# Patient Record
Sex: Female | Born: 1995 | Race: Black or African American | Hispanic: No | Marital: Single | Smoking: Never smoker
Health system: Southern US, Community
[De-identification: ages and names within clinical notes are randomized; demographics above are authoritative.]

## PROBLEM LIST (undated history)

## (undated) ENCOUNTER — Inpatient Hospital Stay (HOSPITAL_COMMUNITY): Payer: Self-pay

## (undated) DIAGNOSIS — O2340 Unspecified infection of urinary tract in pregnancy, unspecified trimester: Secondary | ICD-10-CM

## (undated) DIAGNOSIS — Z862 Personal history of diseases of the blood and blood-forming organs and certain disorders involving the immune mechanism: Secondary | ICD-10-CM

## (undated) DIAGNOSIS — K429 Umbilical hernia without obstruction or gangrene: Secondary | ICD-10-CM

## (undated) DIAGNOSIS — O23599 Infection of other part of genital tract in pregnancy, unspecified trimester: Secondary | ICD-10-CM

## (undated) DIAGNOSIS — A5901 Trichomonal vulvovaginitis: Secondary | ICD-10-CM

## (undated) HISTORY — PX: HERNIA REPAIR: SHX51

## (undated) HISTORY — DX: Infection of other part of genital tract in pregnancy, unspecified trimester: O23.599

## (undated) HISTORY — DX: Trichomonal vulvovaginitis: A59.01

## (undated) HISTORY — DX: Unspecified infection of urinary tract in pregnancy, unspecified trimester: O23.40

## (undated) HISTORY — DX: Personal history of diseases of the blood and blood-forming organs and certain disorders involving the immune mechanism: Z86.2

---
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
## 2014-08-13 ENCOUNTER — Encounter: Payer: Self-pay | Admitting: Emergency Medicine

## 2014-08-13 DIAGNOSIS — K5900 Constipation, unspecified: Secondary | ICD-10-CM | POA: Diagnosis present

## 2014-08-13 DIAGNOSIS — B009 Herpesviral infection, unspecified: Secondary | ICD-10-CM | POA: Diagnosis not present

## 2014-08-13 DIAGNOSIS — K5641 Fecal impaction: Secondary | ICD-10-CM | POA: Diagnosis not present

## 2014-08-13 DIAGNOSIS — Z3202 Encounter for pregnancy test, result negative: Secondary | ICD-10-CM | POA: Insufficient documentation

## 2014-08-13 NOTE — ED Notes (Signed)
Has not had a real BM for 8-7.  States she took a laxative the Friday before last and was able to have a large bowel movement.  After that she only had liquid stools.  Took another laxative on the following Thursday.  Still has not had another solid formed bm.  States she normally has bowel movements every 2 days.  Also noted blood during her BM the Friday before last but it was a very difficult stool to pass.  No blood since then.

## 2014-08-14 ENCOUNTER — Emergency Department
Admission: EM | Admit: 2014-08-14 | Discharge: 2014-08-14 | Disposition: A | Discharge status: Home / Self Care | Payer: BLUE CROSS/BLUE SHIELD | Attending: Emergency Medicine | Admitting: Emergency Medicine

## 2014-08-14 ENCOUNTER — Emergency Department: Payer: BLUE CROSS/BLUE SHIELD

## 2014-08-14 DIAGNOSIS — B009 Herpesviral infection, unspecified: Secondary | ICD-10-CM

## 2014-08-14 DIAGNOSIS — K5641 Fecal impaction: Secondary | ICD-10-CM

## 2014-08-14 DIAGNOSIS — K5900 Constipation, unspecified: Secondary | ICD-10-CM

## 2014-08-14 HISTORY — DX: Umbilical hernia without obstruction or gangrene: K42.9

## 2014-08-14 LAB — POCT PREGNANCY, URINE: PREG TEST UR: NEGATIVE

## 2014-08-14 MED ORDER — MAGNESIUM CITRATE PO SOLN
1.0000 | Freq: Once | ORAL | Status: AC
  Administered 2014-08-14: 1 via ORAL

## 2014-08-14 MED ORDER — VALACYCLOVIR HCL 1 G PO TABS: 1000.0000 mg | ORAL_TABLET | Freq: Three times a day (TID) | ORAL | Status: DC

## 2014-08-14 MED ORDER — MAGNESIUM CITRATE PO SOLN
ORAL | Status: AC
  Administered 2014-08-14: 1 via ORAL
  Filled 2014-08-14: qty 296

## 2014-08-14 MED ORDER — DOCUSATE SODIUM 50 MG/5ML PO LIQD
ORAL | Status: AC
  Filled 2014-08-14: qty 10

## 2014-08-14 MED ORDER — LACTULOSE 10 GM/15ML PO SOLN: 20.0000 g | Freq: Every day | ORAL | Status: DC | PRN

## 2014-08-14 NOTE — ED Provider Notes (Signed)
[facility] Emergency Department Provider Note  ____________________________________________  Time seen: Approximately 3:56 AM  I have reviewed the triage vital signs and the nursing notes.   HISTORY  Chief Complaint Constipation    HPI Haley Scott is a 19 y.o. female who presents with constipation. States she usually has bowel movements every 2 days. Has not had a "good" BM for 7-8 days. Denies recent change in diet, medication or travel. States she took a laxity of approximately 6 days ago with good results but has had only liquid stools since then. Took another laxity of 3 days ago without having a solid formed BM. Patient complains of hard balls of stool which require straining to push out. Patient presents because she noted blood on her attempt to have a BM several days ago. Patient denies fever, chills, chest pain, shortness of breath, abdominal pain, nausea, vomiting, fainting, headache, weakness, numbness, tingling.   Past Medical History  Diagnosis Date  . Umbilical hernia 2001    There are no active problems to display for this patient.   Past Surgical History  Procedure Laterality Date  . Vaginal delivery  2015    Current Outpatient Rx  Name  Route  Sig  Dispense  Refill  . lactulose (CHRONULAC) 10 GM/15ML solution   Oral   Take 30 mLs (20 g total) by mouth daily as needed for mild constipation.   120 mL   0     Allergies Ibuprofen  No family history on file.  Social History History  Substance Use Topics  . Smoking status: Never Smoker   . Smokeless tobacco: Not on file  . Alcohol Use: No    Review of Systems Constitutional: No fever/chills Eyes: No visual changes. ENT: No sore throat. Cardiovascular: Denies chest pain. Respiratory: Denies shortness of breath. Gastrointestinal: No abdominal pain.  No nausea, no vomiting.  No diarrhea.  Positive for constipation. Genitourinary: Negative for dysuria. Musculoskeletal:  Negative for back pain. Skin: Negative for rash. Neurological: Negative for headaches, focal weakness or numbness.  10-point ROS otherwise negative.  ____________________________________________   PHYSICAL EXAM:  VITAL SIGNS: ED Triage Vitals  Enc Vitals Group     BP 08/13/14 2312 118/65 mmHg     Pulse Rate 08/13/14 2312 89     Resp 08/13/14 2312 18     Temp 08/13/14 2312 98.3 F (36.8 C)     Temp Source 08/13/14 2312 Oral     SpO2 08/13/14 2312 97 %     Weight 08/13/14 2312 105 lb (47.628 kg)     Height 08/13/14 2312 4\' 11"  (1.499 m)     Head Cir --      Peak Flow --      Pain Score 08/13/14 2313 8     Pain Loc --      Pain Edu? --      Excl. in GC? --     Constitutional: Alert and oriented. Well appearing and in no acute distress. Eyes: Conjunctivae are normal. PERRL. EOMI. Head: Atraumatic. Nose: No congestion/rhinnorhea. Mouth/Throat: Mucous membranes are moist.  Oropharynx non-erythematous. Neck: No stridor.   Cardiovascular: Normal rate, regular rhythm. Grossly normal heart sounds.  Good peripheral circulation. Respiratory: Normal respiratory effort.  No retractions. Lungs CTAB. Gastrointestinal: Soft and nontender. No distention. Positive bowel sounds in all 4 quadrants. No abdominal bruits. No CVA tenderness. Genitourinary: Vesicles noted on bilateral external labia. Rectal: No external hemorrhoids visualized. Rectal exam reveals hard stool in rectal vault. Digital stimulation performed.  Small amount of stool disimpacted. No bleeding noted. Non-tender rectal exam. Musculoskeletal: No lower extremity tenderness nor edema.  No joint effusions. Neurologic:  Normal speech and language. No gross focal neurologic deficits are appreciated. Speech is normal. No gait instability. Skin:  Skin is warm, dry and intact. No rash noted. Psychiatric: Mood and affect are normal. Speech and behavior are normal.  ____________________________________________   LABS (all labs  ordered are listed, but only abnormal results are displayed)  Labs Reviewed  POCT PREGNANCY, URINE   ____________________________________________  EKG  None ____________________________________________  RADIOLOGY  Abdomen 3 way (viewed by me, interpreted by Dr. Karie Kirks): Large amount of retained large bowel stool without bowel obstruction.  Normal chest.  ____________________________________________   PROCEDURES  Procedure(s) performed: Manual disimpaction with small amount of stool removed.  Critical Care performed: No  ____________________________________________   INITIAL IMPRESSION / ASSESSMENT AND PLAN / ED COURSE  Pertinent labs & imaging results that were available during my care of the patient were reviewed by me and considered in my medical decision making (see chart for details).  20 year old female who presents with constipation and fecal impaction without vomiting or abdominal pain. X-ray does reveal no sign of obstruction. Discussed with patient and will proceed with a soapsuds enema in the ED.  ----------------------------------------- 6:16 AM on 08/14/2014 -----------------------------------------  Patient had a good bowel movement after soapsuds enema. Patient denies dysuria, vaginal discharge or pelvic pain. Will start patient on Valtrex for genital herpes. Encouraged her to inform her partner to seek treatment at the health [facility]. Discussed with patient and given strict return precautions. Patient verbalizes understanding and agrees with plan of care. ____________________________________________   FINAL CLINICAL IMPRESSION(S) / ED DIAGNOSES  Final diagnoses:  Constipation, unspecified constipation type  Fecal impaction  Herpes      Irean Hong, MD 08/14/14 [phone]

## 2014-08-14 NOTE — Discharge Instructions (Signed)
1. Take laxitve as needed for bowel movements (Lactulose). 2. Drink plenty of fluids daily. 3. Take Valtrex 1 g 3 times daily 7 days. 4. Notify any sexual partners to seek treatment at the health Department. 5. Return to the ER for worsening symptoms, persistent vomiting, abdominal pain or other concerns.  Constipation Constipation is when a person:  Poops (has a bowel movement) less than 3 times a week.  Has a hard time pooping.  Has poop that is dry, hard, or bigger than normal. HOME CARE   Eat foods with a lot of fiber in them. This includes fruits, vegetables, beans, and whole grains such as brown rice.  Avoid fatty foods and foods with a lot of sugar. This includes french fries, hamburgers, cookies, candy, and soda.  If you are not getting enough fiber from food, take products with added fiber in them (supplements).  Drink enough fluid to keep your pee (urine) clear or pale yellow.  Exercise on a regular basis, or as told by your doctor.  Go to the restroom when you feel like you need to poop. Do not hold it.  Only take medicine as told by your doctor. Do not take medicines that help you poop (laxatives) without talking to your doctor first. GET HELP RIGHT AWAY IF:   You have bright red blood in your poop (stool).  Your constipation lasts more than 4 days or gets worse.  You have belly (abdominal) or butt (rectal) pain.  You have thin poop (as thin as a pencil).  You lose weight, and it cannot be explained. MAKE SURE YOU:   Understand these instructions.  Will watch your condition.  Will get help right away if you are not doing well or get worse. Document Released: 08/06/2007 Document Revised: 02/22/2013 Document Reviewed: 11/29/2012 [facility] Patient Information 2015 [place], [place]. This information is not intended to replace advice given to you by your health care provider. Make sure you discuss any questions you have with your health care provider.  Fecal  Impaction A fecal impaction happens when there is a large, firm amount of stool (or feces) that cannot be passed. The impacted stool is usually in the rectum, which is the lowest part of the large bowel. The impacted stool can block the colon and cause significant problems. CAUSES  The longer stool stays in the rectum, the harder it gets. Anything that slows down your bowel movements can lead to fecal impaction, such as:  Constipation. This can be a long-standing (chronic) problem or can happen suddenly (acute).  Painful conditions of the rectum, such as hemorrhoids or anal fissures. The pain of these conditions can make you try to avoid having bowel movements.  Narcotic pain-relieving medicines, such as methadone, morphine, or codeine.  Not drinking enough fluids.  Inactivity and bed rest over long periods of time.  Diseases of the brain or nervous system that damage the nerves controlling the muscles of the intestines. SIGNS AND SYMPTOMS   Lack of normal bowel movements or changes in bowel patterns.  Sense of fullness in the rectum but unable to pass stool.  Pain or cramps in the abdominal area (often after meals).  Thin, watery discharge from the rectum. DIAGNOSIS  Your health care provider may suspect that you have a fecal impaction based on your symptoms and a physical exam. This will include an exam of your rectum. Sometimes X-rays or lab testing may be needed to confirm the diagnosis and to be sure there are no other  problems.  TREATMENT   Initially an impaction can be removed manually. Using a gloved finger, your health care provider can remove hard stool from your rectum.  Medicine is sometimes needed. A suppository or enema can be given in the rectum to soften the stool, which can stimulate a bowel movement. Medicines can also be given by mouth (orally).  Though rare, surgery may be needed if the colon has torn (perforated) due to blockage. HOME CARE INSTRUCTIONS    Develop regular bowel habits. This could include getting in the habit of having a bowel movement after your morning cup of coffee or after eating. Be sure to allow yourself enough time on the toilet.  Maintain a high-fiber diet.  Drink enough fluids to keep your urine clear or pale yellow as directed by your health care provider.  Exercise regularly.  If you begin to get constipated, increase the amount of fiber in your diet. Eat plenty of fruits, vegetables, whole wheat breads, bran, oatmeal, and similar products.  Take natural fiber laxatives or other laxatives only as directed by your health care provider. SEEK MEDICAL CARE IF:   You have ongoing rectal pain.  You require enemas or suppositories more than twice a week.  You have rectal bleeding.  You have continued problems, or you develop abdominal pain.  You have thin, pencil-like stools. SEEK IMMEDIATE MEDICAL CARE IF:  You have black or tarry stools. MAKE SURE YOU:   Understand these instructions.  Will watch your condition.  Will get help right away if you are not doing well or get worse. Document Released: 11/10/2003 Document Revised: 12/08/2012 Document Reviewed: 08/24/2012 [facility] Patient Information 2015 [place], [place]. This information is not intended to replace advice given to you by your health care provider. Make sure you discuss any questions you have with your health care provider.  Herpes Labialis You have a fever blister or cold sore (herpes labialis). These painful, grouped sores are caused by one of the herpes viruses (HSV1 most commonly). They are usually found around the lips and mouth, but the same infection can also affect other areas on the face such as the nose and eyes. Herpes infections take about 10 days to heal. They often occur again and again in the same spot. Other symptoms may include numbness and tingling in the involved skin, achiness, fever, and swollen glands in the neck. Colds,  emotional stress, injuries, or excess sunlight exposure all seem to make herpes reappear. Herpes lip infections are contagious. Direct contact with these sores can spread the infection. It can also be spread to other parts of your own body. TREATMENT  Herpes labialis is usually self-limited and resolves within 1 week. To reduce pain and swelling, apply ice packs frequently to the sores or suck on popsicles or frozen juice bars. Antiviral medicine may be used by mouth to shorten the duration of the breakout. Avoid spreading the infection by washing your hands often. Be careful not to touch your eyes or genital areas after handling the infected blisters. Do not kiss or have other intimate contact with others. After the blisters are completely healed you may resume contact. Use sunscreen to lessen recurrences.  If this is your first infection with herpes, or if you have a severe or repeated infections, your caregiver may prescribe one of the anti-viral drugs to speed up the healing. If you have sun-related flare-ups despite the use of sunscreen, starting oral anti-viral medicine before a prolonged exposure (going skiing or to the beach) can  prevent most episodes.  SEEK IMMEDIATE MEDICAL CARE IF:  You develop a headache, sleepiness, high fever, vomiting, or severe weakness.  You have eye irritation, pain, blurred vision or redness.  You develop a prolonged infection not getting better in 10 days. Document Released: 02/17/2005 Document Revised: 05/12/2011 Document Reviewed: 12/22/2008 [facility] Patient Information 2015 [place], [place]. This information is not intended to replace advice given to you by your health care provider. Make sure you discuss any questions you have with your health care provider.

## 2015-05-03 ENCOUNTER — Emergency Department
Admission: EM | Admit: 2015-05-03 | Discharge: 2015-05-03 | Disposition: A | Discharge status: Home / Self Care | Payer: Medicaid Other | Attending: Emergency Medicine | Admitting: Emergency Medicine

## 2015-05-03 ENCOUNTER — Encounter: Payer: Self-pay | Admitting: Emergency Medicine

## 2015-05-03 DIAGNOSIS — Z79899 Other long term (current) drug therapy: Secondary | ICD-10-CM | POA: Insufficient documentation

## 2015-05-03 DIAGNOSIS — K047 Periapical abscess without sinus: Secondary | ICD-10-CM | POA: Diagnosis not present

## 2015-05-03 DIAGNOSIS — K032 Erosion of teeth: Secondary | ICD-10-CM | POA: Diagnosis not present

## 2015-05-03 DIAGNOSIS — K0889 Other specified disorders of teeth and supporting structures: Secondary | ICD-10-CM | POA: Diagnosis present

## 2015-05-03 MED ORDER — LIDOCAINE-EPINEPHRINE 2 %-1:100000 IJ SOLN
1.7000 mL | Freq: Once | INTRAMUSCULAR | Status: AC
  Administered 2015-05-03: 1.7 mL
  Filled 2015-05-03: qty 1.7

## 2015-05-03 MED ORDER — MAGIC MOUTHWASH W/LIDOCAINE: 5.0000 mL | Freq: Four times a day (QID) | ORAL | Status: DC

## 2015-05-03 MED ORDER — AMOXICILLIN 875 MG PO TABS: 875.0000 mg | ORAL_TABLET | Freq: Two times a day (BID) | ORAL | Status: DC

## 2015-05-03 NOTE — ED Notes (Signed)
Dental pain right side for 2 weeks.

## 2015-05-03 NOTE — Discharge Instructions (Signed)
Dental Abscess °A dental abscess is a collection of pus in or around a tooth. °CAUSES °This condition is caused by a bacterial infection around the root of the tooth that involves the inner part of the tooth (pulp). It may result from: °· Severe tooth decay. °· Trauma to the tooth that allows bacteria to enter into the pulp, such as a broken or chipped tooth. °· Severe gum disease around a tooth. °SYMPTOMS °Symptoms of this condition include: °· Severe pain in and around the infected tooth. °· Swelling and redness around the infected tooth, in the mouth, or in the face. °· Tenderness. °· Pus drainage. °· Bad breath. °· Bitter taste in the mouth. °· Difficulty swallowing. °· Difficulty opening the mouth. °· Nausea. °· Vomiting. °· Chills. °· Swollen neck glands. °· Fever. °DIAGNOSIS °This condition is diagnosed with examination of the infected tooth. During the exam, your dentist may tap on the infected tooth. Your dentist will also ask about your medical and dental history and may order X-rays. °TREATMENT °This condition is treated by eliminating the infection. This may be done with: °· Antibiotic medicine. °· A root canal. This may be performed to save the tooth. °· Pulling (extracting) the tooth. This may also involve draining the abscess. This is done if the tooth cannot be saved. °HOME CARE INSTRUCTIONS °· Take medicines only as directed by your dentist. °· If you were prescribed antibiotic medicine, finish all of it even if you start to feel better. °· Rinse your mouth (gargle) often with salt water to relieve pain or swelling. °· Do not drive or operate heavy machinery while taking pain medicine. °· Do not apply heat to the outside of your mouth. °· Keep all follow-up visits as directed by your dentist. This is important. °SEEK MEDICAL CARE IF: °· Your pain is worse and is not helped by medicine. °SEEK IMMEDIATE MEDICAL CARE IF: °· You have a fever or chills. °· Your symptoms suddenly get worse. °· You have a  very bad headache. °· You have problems breathing or swallowing. °· You have trouble opening your mouth. °· You have swelling in your neck or around your eye. °  °This information is not intended to replace advice given to you by your health care provider. Make sure you discuss any questions you have with your health care provider. °  °Document Released: 02/17/2005 Document Revised: 07/04/2014 Document Reviewed: 02/14/2014 °Elsevier Interactive Patient Education ©2016 [employer] ° °Dental Care and Dentist Visits °Dental care supports good overall health. Regular dental visits can also help you avoid dental pain, bleeding, infection, and other more serious health problems in the future. It is important to keep the mouth healthy because diseases in the teeth, gums, and other oral tissues can spread to other areas of the body. Some problems, such as diabetes, heart disease, and pre-term labor have been associated with poor oral health.  °See your dentist every 6 months. If you experience emergency problems such as a toothache or broken tooth, go to the dentist right away. If you see your dentist regularly, you may catch problems early. It is easier to be treated for problems in the early stages.  °WHAT TO EXPECT AT A DENTIST VISIT  °Your dentist will look for many common oral health problems and recommend proper treatment. At your regular dental visit, you can expect: °· Gentle cleaning of the teeth and gums. This includes scraping and polishing. This helps to remove the sticky substance around the teeth and gums (  plaque). Plaque forms in the mouth shortly after eating. Over time, plaque hardens on the teeth as tartar. If tartar is not removed regularly, it can cause problems. Cleaning also helps remove stains. °· Periodic X-rays. These pictures of the teeth and supporting bone will help your dentist assess the health of your teeth. °· Periodic fluoride treatments. Fluoride is a natural mineral shown to help  strengthen teeth. Fluoride treatment involves applying a fluoride gel or varnish to the teeth. It is most commonly done in children. °· Examination of the mouth, tongue, jaws, teeth, and gums to look for any oral health problems, such as: °· Cavities (dental caries). This is decay on the tooth caused by plaque, sugar, and acid in the mouth. It is best to catch a cavity when it is small. °· Inflammation of the gums caused by plaque buildup (gingivitis). °· Problems with the mouth or malformed or misaligned teeth. °· Oral cancer or other diseases of the soft tissues or jaws.  °KEEP YOUR TEETH AND GUMS HEALTHY °For healthy teeth and gums, follow these general guidelines as well as your dentist's specific advice: °· Have your teeth professionally cleaned at the dentist every 6 months. °· Brush twice daily with a fluoride toothpaste. °· Floss your teeth daily.  °· Ask your dentist if you need fluoride supplements, treatments, or fluoride toothpaste. °· Eat a healthy diet. Reduce foods and drinks with added sugar. °· Avoid smoking. °TREATMENT FOR ORAL HEALTH PROBLEMS °If you have oral health problems, treatment varies depending on the conditions present in your teeth and gums. °· Your caregiver will most likely recommend good oral hygiene at each visit. °· For cavities, gingivitis, or other oral health disease, your caregiver will perform a procedure to treat the problem. This is typically done at a separate appointment. Sometimes your caregiver will refer you to another dental specialist for specific tooth problems or for surgery. °SEEK IMMEDIATE DENTAL CARE IF: °· You have pain, bleeding, or soreness in the gum, tooth, jaw, or mouth area. °· A permanent tooth becomes loose or separated from the gum socket. °· You experience a blow or injury to the mouth or jaw area. °  °This information is not intended to replace advice given to you by your health care provider. Make sure you discuss any questions you have with your  health care provider. °  °Document Released: 10/30/2010 Document Revised: 05/12/2011 Document Reviewed: 10/30/2010 °Elsevier Interactive Patient Education ©2016 [employer] ° °Dental Pain °Dental pain may be caused by many things, including: °· Tooth decay (cavities or caries). Cavities expose the nerve of your tooth to air and hot or cold temperatures. This can cause pain or discomfort. °· Abscess or infection. A dental abscess is a collection of infected pus from a bacterial infection in the inner part of the tooth (pulp). It usually occurs at the end of the tooth's root. °· Injury. °· An unknown reason (idiopathic). °Your pain may be mild or severe. It may only occur when: °· You are chewing. °· You are exposed to hot or cold temperature. °· You are eating or drinking sugary foods or beverages, such as soda or candy. °Your pain may also be constant. °HOME CARE INSTRUCTIONS °Watch your dental pain for any changes. The following actions may help to lessen any discomfort that you are feeling: °· Take medicines only as directed by your dentist. °· If you were prescribed an antibiotic medicine, finish all of it even if you start to feel better. °· Keep all follow-up visits   as directed by your dentist. This is important. °· Do not apply heat to the outside of your face. °· Rinse your mouth or gargle with salt water if directed by your dentist. This helps with pain and swelling. °¨ You can make salt water by adding ¼ tsp of salt to 1 cup of warm water. °· Apply ice to the painful area of your face: °¨ Put ice in a plastic bag. °¨ Place a towel between your skin and the bag. °¨ Leave the ice on for 20 minutes, 2-3 times per day. °· Avoid foods or drinks that cause you pain, such as: °¨ Very hot or very cold foods or drinks. °¨ Sweet or sugary foods or drinks. °SEEK MEDICAL CARE IF: °· Your pain is not controlled with medicines. °· Your symptoms are worse. °· You have new symptoms. °SEEK IMMEDIATE MEDICAL CARE  IF: °· You are unable to open your mouth. °· You are having trouble breathing or swallowing. °· You have a fever. °· Your face, neck, or jaw is swollen. °  °This information is not intended to replace advice given to you by your health care provider. Make sure you discuss any questions you have with your health care provider. °  °Document Released: 02/17/2005 Document Revised: 07/04/2014 Document Reviewed: 02/13/2014 °Elsevier Interactive Patient Education ©2016 [employer] ° °

## 2015-05-03 NOTE — ED Provider Notes (Signed)
[facility] Emergency Department Provider Note  ____________________________________________  Time seen: Approximately 4:43 PM  I have reviewed the triage vital signs and the nursing notes.   HISTORY  Chief Complaint Dental Pain    HPI Haley Scott is a 20 y.o. female who presents emergency department complaining of right lower dental pain. Patient states that this pain has been present 2 weeks. She has known dental erosion into the most into the right lower jaw. Patient states that she was seen by a dentist and told that she had "an infection" and was told to present to the emergency department for treatment of same. She has an appointment in 1 month with an oral surgeon to discuss dental extraction. Patient states the pain is sharp, severe, constant. Patient has not taken any medications prior to arrival. Patient denies any fevers or chills, difficulty breathing or swallowing, chest pain, shortness of breath, abdominal pain, nausea or vomiting.   Past Medical History  Diagnosis Date  . Umbilical hernia 2001    There are no active problems to display for this patient.   Past Surgical History  Procedure Laterality Date  . Vaginal delivery  2015  . Hernia repair      Current Outpatient Rx  Name  Route  Sig  Dispense  Refill  . amoxicillin (AMOXIL) 875 MG tablet   Oral   Take 1 tablet (875 mg total) by mouth 2 (two) times daily.   14 tablet   0   . lactulose (CHRONULAC) 10 GM/15ML solution   Oral   Take 30 mLs (20 g total) by mouth daily as needed for mild constipation.   120 mL   0   . magic mouthwash w/lidocaine SOLN   Oral   Take 5 mLs by mouth 4 (four) times daily.   240 mL   0     Dispense in a 1/1/1/1 ratio. Use lidocaine, diphen ...   . valACYclovir (VALTREX) 1000 MG tablet   Oral   Take 1 tablet (1,000 mg total) by mouth 3 (three) times daily.   21 tablet   0     Allergies Ibuprofen  No family history on file.  Social  History Social History  Substance Use Topics  . Smoking status: Never Smoker   . Smokeless tobacco: None  . Alcohol Use: No     Review of Systems  Constitutional: No fever/chills ENT: No sore throat. Endorses right lower dental pain. Cardiovascular: no chest pain. Respiratory: no cough. No SOB. Skin: Negative for rash. Neurological: Negative for headaches, focal weakness or numbness. 10-point ROS otherwise negative.  ____________________________________________   PHYSICAL EXAM:  VITAL SIGNS: ED Triage Vitals  Enc Vitals Group     BP 05/03/15 1613 118/79 mmHg     Pulse Rate 05/03/15 1613 110     Resp 05/03/15 1613 18     Temp 05/03/15 1613 99.4 F (37.4 C)     Temp Source 05/03/15 1613 Oral     SpO2 05/03/15 1613 100 %     Weight 05/03/15 1612 110 lb (49.896 kg)     Height 05/03/15 1612 5' (1.524 m)     Head Cir --      Peak Flow --      Pain Score 05/03/15 1612 10     Pain Loc --      Pain Edu? --      Excl. in GC? --      Constitutional: Alert and oriented. Well appearing and in  no acute distress. Eyes: Conjunctivae are normal. PERRL. EOMI. Head: Atraumatic. ENT:      Ears:       Nose: No congestion/rhinnorhea.      Mouth/Throat: Mucous membranes are moist. Significant dental erosion into the was no tooth on the right lower dentition. Surrounding erythema and edema. No fluctuance to palpation with tongue depressor. No pustular drainage noted. Oropharynx is nonerythematous and nonedematous. Uvula is midline. Neck: No stridor.   Hematological/Lymphatic/Immunilogical: No cervical lymphadenopathy. Cardiovascular: Normal rate, regular rhythm. Normal S1 and S2.  Good peripheral circulation. Respiratory: Normal respiratory effort without tachypnea or retractions. Lungs CTAB. Neurologic:  Normal speech and language. No gross focal neurologic deficits are appreciated.  Skin:  Skin is warm, dry and intact. No rash noted. Psychiatric: Mood and affect are normal. Speech  and behavior are normal. Patient exhibits appropriate insight and judgement.   ____________________________________________   LABS (all labs ordered are listed, but only abnormal results are displayed)  Labs Reviewed - No data to display ____________________________________________  EKG   ____________________________________________  RADIOLOGY   No results found.  ____________________________________________    PROCEDURES  Procedure(s) performed:    Dental block is performed using 2% lidocaine with epi using a 27-gauge needle using the dental Carpijet. Injection is placed into the angle of the mandible infiltrating the mandibular branch of the trigeminal nerve. Patient tolerated procedure well with no complications.   Medications  lidocaine-EPINEPHrine (XYLOCAINE W/EPI) 2 %-1:100000 (with pres) injection 1.7 mL (1.7 mLs Infiltration Given 05/03/15 1652)     ____________________________________________   INITIAL IMPRESSION / ASSESSMENT AND PLAN / ED COURSE  Pertinent labs & imaging results that were available during my care of the patient were reviewed by me and considered in my medical decision making (see chart for details).  Patient's diagnosis is consistent with dental erosion with dental infection. Patient is given a dental block in the emergency department.. Patient will be discharged home with prescriptions for antibiotics and magic mouthwash. Patient is to follow up with dentist if symptoms persist past this treatment course. Patient is given ED precautions to return to the ED for any worsening or new symptoms.     ____________________________________________  FINAL CLINICAL IMPRESSION(S) / ED DIAGNOSES  Final diagnoses:  Dental erosion extending into pulp  Dental infection      NEW MEDICATIONS STARTED DURING THIS VISIT:  New Prescriptions   AMOXICILLIN (AMOXIL) 875 MG TABLET    Take 1 tablet (875 mg total) by mouth 2 (two) times daily.   MAGIC  MOUTHWASH W/LIDOCAINE SOLN    Take 5 mLs by mouth 4 (four) times daily.        Delorise Royals Tagg Eustice, PA-C 05/03/15 1654  Loleta Rose, MD 05/04/15 [phone]

## 2015-06-07 DIAGNOSIS — R509 Fever, unspecified: Secondary | ICD-10-CM | POA: Insufficient documentation

## 2015-06-07 DIAGNOSIS — Z79899 Other long term (current) drug therapy: Secondary | ICD-10-CM | POA: Insufficient documentation

## 2015-06-07 DIAGNOSIS — R52 Pain, unspecified: Secondary | ICD-10-CM | POA: Diagnosis not present

## 2015-06-07 DIAGNOSIS — R69 Illness, unspecified: Secondary | ICD-10-CM | POA: Diagnosis not present

## 2015-06-07 MED ORDER — ACETAMINOPHEN 325 MG PO TABS
ORAL_TABLET | ORAL | Status: AC
  Filled 2015-06-07: qty 2

## 2015-06-07 MED ORDER — ACETAMINOPHEN 325 MG PO TABS
650.0000 mg | ORAL_TABLET | Freq: Once | ORAL | Status: AC
  Administered 2015-06-07: 650 mg via ORAL

## 2015-06-07 NOTE — ED Notes (Signed)
Pt in with co fever and headache for few days.  Was exposed to the flu over the weekend.

## 2015-06-08 ENCOUNTER — Emergency Department
Admission: EM | Admit: 2015-06-08 | Discharge: 2015-06-08 | Disposition: A | Discharge status: Home / Self Care | Payer: Medicaid Other | Attending: Emergency Medicine | Admitting: Emergency Medicine

## 2015-06-08 DIAGNOSIS — R509 Fever, unspecified: Secondary | ICD-10-CM

## 2015-06-08 DIAGNOSIS — B349 Viral infection, unspecified: Secondary | ICD-10-CM

## 2015-06-08 DIAGNOSIS — R52 Pain, unspecified: Secondary | ICD-10-CM

## 2015-06-08 LAB — RAPID INFLUENZA A&B ANTIGENS
Influenza A (ARMC): NEGATIVE
Influenza B (ARMC): NEGATIVE

## 2015-06-08 LAB — POCT RAPID STREP A: Streptococcus, Group A Screen (Direct): NEGATIVE

## 2015-06-08 NOTE — Discharge Instructions (Signed)
Fever, Adult A fever is an increase in the body's temperature. It is usually defined as a temperature of 100F (38C) or higher. Brief mild or moderate fevers generally have no long-term effects, and they often do not require treatment. Moderate or high fevers may make you feel uncomfortable and can sometimes be a sign of a serious illness or disease. The sweating that may occur with repeated or prolonged fever may also cause dehydration. Fever is confirmed by taking a temperature with a thermometer. A measured temperature can vary with:  Age.  Time of day.  Location of the thermometer:  Mouth (oral).  Rectum (rectal).  Ear (tympanic).  Underarm (axillary).  Forehead (temporal). HOME CARE INSTRUCTIONS Pay attention to any changes in your symptoms. Take these actions to help with your condition:  Take over-the counter and prescription medicines only as told by your health care provider. Follow the dosing instructions carefully.  If you were prescribed an antibiotic medicine, take it as told by your health care provider. Do not stop taking the antibiotic even if you start to feel better.  Rest as needed.  Drink enough fluid to keep your urine clear or pale yellow. This helps to prevent dehydration.  Sponge yourself or bathe with room-temperature water to help reduce your body temperature as needed. Do not use ice water.  Do not overbundle yourself in blankets or heavy clothes. SEEK MEDICAL CARE IF:  You vomit.  You cannot eat or drink without vomiting.  You have diarrhea.  You have pain when you urinate.  Your symptoms do not improve with treatment.  You develop new symptoms.  You develop excessive weakness. SEEK IMMEDIATE MEDICAL CARE IF:  You have shortness of breath or have trouble breathing.  You are dizzy or you faint.  You are disoriented or confused.  You develop signs of dehydration, such as a dry mouth, decreased urination, or paleness.  You develop  severe pain in your abdomen.  You have persistent vomiting or diarrhea.  You develop a skin rash.  Your symptoms suddenly get worse.   This information is not intended to replace advice given to you by your health care provider. Make sure you discuss any questions you have with your health care provider.   Document Released: 08/13/2000 Document Revised: 11/08/2014 Document Reviewed: 04/13/2014 Elsevier Interactive Patient Education 2016 [employer]  Musculoskeletal Pain Musculoskeletal pain is muscle and boney aches and pains. These pains can occur in any part of the body. Your caregiver may treat you without knowing the cause of the pain. They may treat you if blood or urine tests, X-rays, and other tests were normal.  CAUSES There is often not a definite cause or reason for these pains. These pains may be caused by a type of germ (virus). The discomfort may also come from overuse. Overuse includes working out too hard when your body is not fit. Boney aches also come from weather changes. Bone is sensitive to atmospheric pressure changes. HOME CARE INSTRUCTIONS   Ask when your test results will be ready. Make sure you get your test results.  Only take over-the-counter or prescription medicines for pain, discomfort, or fever as directed by your caregiver. If you were given medications for your condition, do not drive, operate machinery or power tools, or sign legal documents for 24 hours. Do not drink alcohol. Do not take sleeping pills or other medications that may interfere with treatment.  Continue all activities unless the activities cause more pain. When the pain lessens,  slowly resume normal activities. Gradually increase the intensity and duration of the activities or exercise.  During periods of severe pain, bed rest may be helpful. Lay or sit in any position that is comfortable.  Putting ice on the injured area.  Put ice in a bag.  Place a towel between your skin and the  bag.  Leave the ice on for 15 to 20 minutes, 3 to 4 times a day.  Follow up with your caregiver for continued problems and no reason can be found for the pain. If the pain becomes worse or does not go away, it may be necessary to repeat tests or do additional testing. Your caregiver may need to look further for a possible cause. SEEK IMMEDIATE MEDICAL CARE IF:  You have pain that is getting worse and is not relieved by medications.  You develop chest pain that is associated with shortness or breath, sweating, feeling sick to your stomach (nauseous), or throw up (vomit).  Your pain becomes localized to the abdomen.  You develop any new symptoms that seem different or that concern you. MAKE SURE YOU:   Understand these instructions.  Will watch your condition.  Will get help right away if you are not doing well or get worse.   This information is not intended to replace advice given to you by your health care provider. Make sure you discuss any questions you have with your health care provider.   Document Released: 02/17/2005 Document Revised: 05/12/2011 Document Reviewed: 10/22/2012 Elsevier Interactive Patient Education [employer]

## 2015-06-08 NOTE — ED Notes (Signed)
Pt states she has had fever, sore throat, and body aches since Sunday. Not feeling better and concerned she needed to be seen. States when she takes tylenol at home she feels better. No increased work of breathing or acute distress noted. Denies cough or congestion. Alert and calm at this time.

## 2015-06-08 NOTE — ED Provider Notes (Signed)
[facility] Emergency Department Provider Note  ____________________________________________  Time seen: Approximately 0031 AM  I have reviewed the triage vital signs and the nursing notes.   HISTORY  Chief Complaint Fever    HPI Joanie CoddingtonKieshawn Biederman is a 20 y.o. female who comes into the hospital today because she has not been feeling well. She reports that she watch her nephew on Sunday. She reports that he had the flu and she started feeling unwell on Tuesday. She reports that she had some fever and sore throat. She never took her temperature but did take some Tylenol. She reports that the fever was better and she thought that she was okay but the fever came back today. The patient reports that she also had a headache with the fever. She reports that in the waiting room she was given some Tylenol and she feels much better than she did when she initially arrived. The patient reports that she has been eating and drinking with no vomiting and diarrhea. She's had no cough that she's had some mild body aches. The patient reports that since she was feeling unwell she decided to come in and get checked out. Again the patient denies any pain at this time.   Past Medical History  Diagnosis Date  . Umbilical hernia 2001    There are no active problems to display for this patient.   Past Surgical History  Procedure Laterality Date  . Vaginal delivery  2015  . Hernia repair      Current Outpatient Rx  Name  Route  Sig  Dispense  Refill  . amoxicillin (AMOXIL) 875 MG tablet   Oral   Take 1 tablet (875 mg total) by mouth 2 (two) times daily.   14 tablet   0   . lactulose (CHRONULAC) 10 GM/15ML solution   Oral   Take 30 mLs (20 g total) by mouth daily as needed for mild constipation.   120 mL   0   . magic mouthwash w/lidocaine SOLN   Oral   Take 5 mLs by mouth 4 (four) times daily.   240 mL   0     Dispense in a 1/1/1/1 ratio. Use lidocaine, diphen ...   .  valACYclovir (VALTREX) 1000 MG tablet   Oral   Take 1 tablet (1,000 mg total) by mouth 3 (three) times daily.   21 tablet   0     Allergies Ibuprofen  No family history on file.  Social History Social History  Substance Use Topics  . Smoking status: Never Smoker   . Smokeless tobacco: Not on file  . Alcohol Use: No    Review of Systems Constitutional:  fever/chills Eyes: No visual changes. ENT:  sore throat. Cardiovascular: Denies chest pain. Respiratory: Denies shortness of breath. Gastrointestinal: No abdominal pain.  No nausea, no vomiting.  No diarrhea.  No constipation. Genitourinary: Negative for dysuria. Musculoskeletal: body aches Skin: Negative for rash. Neurological: headache  10-point ROS otherwise negative.  ____________________________________________   PHYSICAL EXAM:  VITAL SIGNS: ED Triage Vitals  Enc Vitals Group     BP 06/07/15 2328 110/71 mmHg     Pulse Rate 06/07/15 2328 118     Resp 06/07/15 2328 18     Temp 06/07/15 2328 100.1 F (37.8 C)     Temp Source 06/07/15 2328 Oral     SpO2 06/07/15 2328 100 %     Weight 06/07/15 2327 112 lb (50.803 kg)     Height 06/07/15  2327  (1.499 m)     Head Cir --      Peak Flow --      Pain Score 06/07/15 2328 8     Pain Loc --      Pain Edu? --      Excl. in GC? --     Constitutional: Alert and oriented. Well appearing and in mild distress. Eyes: Conjunctivae are normal. PERRL. EOMI. Head: Atraumatic. Nose: No congestion/rhinnorhea. Mouth/Throat: Mucous membranes are moist.  Oropharynx non-erythematous. Cardiovascular: Tachycardia regular rhythm. Grossly normal heart sounds.  Good peripheral circulation. Respiratory: Normal respiratory effort.  No retractions. Lungs CTAB. Gastrointestinal: Soft and nontender. No distention. Positive bowel sounds Musculoskeletal: No lower extremity tenderness nor edema.  Neurologic:  Normal speech and language. Skin:  Skin is warm, dry and intact.   Psychiatric: Mood and affect are normal.   ____________________________________________   LABS (all labs ordered are listed, but only abnormal results are displayed)  Labs Reviewed  RAPID INFLUENZA A&B ANTIGENS ([facility] ONLY)  POCT RAPID STREP A   ____________________________________________  EKG  none ____________________________________________  RADIOLOGY  none ____________________________________________   PROCEDURES  Procedure(s) performed: None  Critical Care performed: No  ____________________________________________   INITIAL IMPRESSION / ASSESSMENT AND PLAN / ED COURSE  Pertinent labs & imaging results that were available during my care of the patient were reviewed by me and considered in my medical decision making (see chart for details).  This is a 20 year old female who comes into the hospital today with some body aches and fever. I will check the patient's flu as well as strep throat. The patient reports though that after some Tylenol she feels improved. I will give the patient some oral hydration to help with her tachycardia. I will reassess the patient once I received the results.  Since strep and flu are both negative. At this time we'll reassess the patient's vital signs at determine if she is still tachycardic and disposition the patient to home.  The patient continues to feel well and her heart rate is improved. She'll be discharged home to follow-up with the acute care clinic. ____________________________________________   FINAL CLINICAL IMPRESSION(S) / ED DIAGNOSES  Final diagnoses:  Fever, unspecified fever cause  Body aches  Viral illness      Rebecka Apley, MD 06/08/15 [phone]

## 2015-06-08 NOTE — ED Notes (Signed)
Pt given popsicle and encouraged to keep drinking her gatorade. Pt verbalized understanding. Denies pain.

## 2015-06-08 NOTE — ED Notes (Signed)
Dr. Zenda AlpersWebster at the bedside for pt evaluation. Pt drinking Gatorade. No distress noted.

## 2015-09-10 ENCOUNTER — Encounter (HOSPITAL_COMMUNITY): Payer: Self-pay | Admitting: *Deleted

## 2015-09-10 ENCOUNTER — Inpatient Hospital Stay (HOSPITAL_COMMUNITY)
Admission: AD | Admit: 2015-09-10 | Discharge: 2015-09-10 | Disposition: A | Discharge status: Home / Self Care | Payer: Medicaid Other | Source: Ambulatory Visit | Attending: Obstetrics & Gynecology | Admitting: Obstetrics & Gynecology

## 2015-09-10 ENCOUNTER — Inpatient Hospital Stay (HOSPITAL_COMMUNITY): Payer: Medicaid Other

## 2015-09-10 DIAGNOSIS — D539 Nutritional anemia, unspecified: Secondary | ICD-10-CM | POA: Diagnosis not present

## 2015-09-10 DIAGNOSIS — O23591 Infection of other part of genital tract in pregnancy, first trimester: Secondary | ICD-10-CM | POA: Insufficient documentation

## 2015-09-10 DIAGNOSIS — N760 Acute vaginitis: Secondary | ICD-10-CM | POA: Diagnosis not present

## 2015-09-10 DIAGNOSIS — R102 Pelvic and perineal pain: Secondary | ICD-10-CM | POA: Diagnosis present

## 2015-09-10 DIAGNOSIS — Z3A11 11 weeks gestation of pregnancy: Secondary | ICD-10-CM | POA: Insufficient documentation

## 2015-09-10 DIAGNOSIS — Z886 Allergy status to analgesic agent status: Secondary | ICD-10-CM | POA: Insufficient documentation

## 2015-09-10 DIAGNOSIS — O26891 Other specified pregnancy related conditions, first trimester: Secondary | ICD-10-CM | POA: Diagnosis not present

## 2015-09-10 DIAGNOSIS — A499 Bacterial infection, unspecified: Secondary | ICD-10-CM

## 2015-09-10 DIAGNOSIS — Z79899 Other long term (current) drug therapy: Secondary | ICD-10-CM | POA: Insufficient documentation

## 2015-09-10 DIAGNOSIS — B9689 Other specified bacterial agents as the cause of diseases classified elsewhere: Secondary | ICD-10-CM | POA: Diagnosis not present

## 2015-09-10 DIAGNOSIS — Z3490 Encounter for supervision of normal pregnancy, unspecified, unspecified trimester: Secondary | ICD-10-CM

## 2015-09-10 DIAGNOSIS — D508 Other iron deficiency anemias: Secondary | ICD-10-CM

## 2015-09-10 LAB — URINALYSIS, ROUTINE W REFLEX MICROSCOPIC
Bilirubin Urine: NEGATIVE
Glucose, UA: NEGATIVE mg/dL
Hgb urine dipstick: NEGATIVE
KETONES UR: 40 mg/dL — AB
NITRITE: NEGATIVE
Protein, ur: NEGATIVE mg/dL
Specific Gravity, Urine: 1.025 (ref 1.005–1.030)
pH: 6 (ref 5.0–8.0)

## 2015-09-10 LAB — WET PREP, GENITAL
SPERM: NONE SEEN
TRICH WET PREP: NONE SEEN
YEAST WET PREP: NONE SEEN

## 2015-09-10 LAB — HCG, QUANTITATIVE, PREGNANCY: hCG, Beta Chain, Quant, S: 93684 m[IU]/mL — ABNORMAL HIGH (ref <5)

## 2015-09-10 LAB — ABO/RH: ABO/RH(D): O POS

## 2015-09-10 LAB — URINE MICROSCOPIC-ADD ON: RBC / HPF: NONE SEEN RBC/hpf (ref 0–5)

## 2015-09-10 LAB — POCT PREGNANCY, URINE: PREG TEST UR: POSITIVE — AB

## 2015-09-10 LAB — CBC
HCT: 28 % — ABNORMAL LOW (ref 36.0–46.0)
Hemoglobin: 8.9 g/dL — ABNORMAL LOW (ref 12.0–15.0)
MCH: 23.3 pg — AB (ref 26.0–34.0)
MCHC: 31.8 g/dL (ref 30.0–36.0)
MCV: 73.3 fL — ABNORMAL LOW (ref 78.0–100.0)
Platelets: 347 10*3/uL (ref 150–400)
RBC: 3.82 MIL/uL — ABNORMAL LOW (ref 3.87–5.11)
RDW: 18.4 % — AB (ref 11.5–15.5)
WBC: 8.1 10*3/uL (ref 4.0–10.5)

## 2015-09-10 MED ORDER — FERROUS SULFATE 325 (65 FE) MG PO TABS: 325.0000 mg | ORAL_TABLET | Freq: Three times a day (TID) | ORAL | Status: DC

## 2015-09-10 MED ORDER — METRONIDAZOLE 500 MG PO TABS: 500.0000 mg | ORAL_TABLET | Freq: Two times a day (BID) | ORAL | Status: DC

## 2015-09-10 NOTE — MAU Note (Signed)
Pt states she had a positive home preg test and wants to know how far along she is and she thinks she has a bacterial infection.

## 2015-09-10 NOTE — MAU Provider Note (Signed)
History     CSN: [id number]  Arrival date and time: 09/10/15 2024   First Provider Initiated Contact with Patient 09/10/15 2154      Chief Complaint  Patient presents with  . Vaginal Discharge  . Pelvic Pain   Vaginal Discharge The patient's primary symptoms include pelvic pain and vaginal discharge. This is a new problem. The current episode started yesterday. The problem occurs constantly. The problem has been unchanged. Pain severity now: 6/10  The problem affects both sides. She is pregnant. Associated symptoms include back pain and nausea. Pertinent negatives include no chills, constipation, diarrhea, dysuria, fever, frequency, urgency or vomiting. The vaginal discharge was normal (but increased recently). There has been no bleeding. Menstrual history: LMP 06/29/15     Past Medical History  Diagnosis Date  . Umbilical hernia 2001    Past Surgical History  Procedure Laterality Date  . Vaginal delivery  2015  . Hernia repair      No family history on file.  Social History  Substance Use Topics  . Smoking status: Never Smoker   . Smokeless tobacco: Not on file  . Alcohol Use: No    Allergies:  Allergies  Allergen Reactions  . Ibuprofen Hives    Prescriptions prior to admission  Medication Sig Dispense Refill Last Dose  . amoxicillin (AMOXIL) 875 MG tablet Take 1 tablet (875 mg total) by mouth 2 (two) times daily. 14 tablet 0   . lactulose (CHRONULAC) 10 GM/15ML solution Take 30 mLs (20 g total) by mouth daily as needed for mild constipation. 120 mL 0   . magic mouthwash w/lidocaine SOLN Take 5 mLs by mouth 4 (four) times daily. 240 mL 0   . valACYclovir (VALTREX) 1000 MG tablet Take 1 tablet (1,000 mg total) by mouth 3 (three) times daily. 21 tablet 0     Review of Systems  Constitutional: Negative for fever and chills.  Gastrointestinal: Positive for nausea. Negative for vomiting, diarrhea and constipation.  Genitourinary: Positive for vaginal discharge and  pelvic pain. Negative for dysuria, urgency and frequency.       Urine is malodorous   Musculoskeletal: Positive for back pain.   Physical Exam   Blood pressure 104/62, pulse 102, temperature 98.5 F (36.9 C), temperature source Oral, resp. rate 18, height 5' (1.524 m), weight 49.497 kg (109 lb 1.9 oz), last menstrual period 06/29/2015, SpO2 100 %.  Physical Exam  Nursing note and vitals reviewed. Constitutional: She is oriented to person, place, and time. She appears well-developed and well-nourished. No distress.  HENT:  Head: Normocephalic.  Cardiovascular: Normal rate.   Respiratory: Effort normal.  GI: Soft. There is no tenderness. There is no rebound.  Genitourinary:   External: no lesion Vagina: small amount of white discharge Cervix: pink, smooth, no CMT Uterus: NSSC Adnexa: NT   Neurological: She is alert and oriented to person, place, and time.  Skin: Skin is warm and dry.  Psychiatric: She has a normal mood and affect.   Results for orders placed or performed during the hospital encounter of 09/10/15 (from the past 24 hour(s))  Urinalysis, Routine w reflex microscopic (not at [facility])     Status: Abnormal   Collection Time: 09/10/15  9:05 PM  Result Value Ref Range   Color, Urine YELLOW YELLOW   APPearance CLEAR CLEAR   Specific Gravity, Urine 1.025 1.005 - 1.030   pH 6.0 5.0 - 8.0   Glucose, UA NEGATIVE NEGATIVE mg/dL   Hgb urine dipstick NEGATIVE NEGATIVE  Bilirubin Urine NEGATIVE NEGATIVE   Ketones, ur 40 (A) NEGATIVE mg/dL   Protein, ur NEGATIVE NEGATIVE mg/dL   Nitrite NEGATIVE NEGATIVE   Leukocytes, UA TRACE (A) NEGATIVE  Urine microscopic-add on     Status: Abnormal   Collection Time: 09/10/15  9:05 PM  Result Value Ref Range   Squamous Epithelial / LPF 0-5 (A) NONE SEEN   WBC, UA 6-30 0 - 5 WBC/hpf   RBC / HPF NONE SEEN 0 - 5 RBC/hpf   Bacteria, UA FEW (A) NONE SEEN   Urine-Other MUCOUS PRESENT   Pregnancy, urine POC     Status: Abnormal    Collection Time: 09/10/15  9:44 PM  Result Value Ref Range   Preg Test, Ur POSITIVE (A) NEGATIVE  CBC     Status: Abnormal   Collection Time: 09/10/15  9:56 PM  Result Value Ref Range   WBC 8.1 4.0 - 10.5 K/uL   RBC 3.82 (L) 3.87 - 5.11 MIL/uL   Hemoglobin 8.9 (L) 12.0 - 15.0 g/dL   HCT 11.9 (L) 14.7 - 82.9 %   MCV 73.3 (L) 78.0 - 100.0 fL   MCH 23.3 (L) 26.0 - 34.0 pg   MCHC 31.8 30.0 - 36.0 g/dL   RDW 56.2 (H) 13.0 - 86.5 %   Platelets 347 150 - 400 K/uL  ABO/Rh     Status: None (Preliminary result)   Collection Time: 09/10/15  9:56 PM  Result Value Ref Range   ABO/RH(D) O POS   Wet prep, genital     Status: Abnormal   Collection Time: 09/10/15 10:10 PM  Result Value Ref Range   Yeast Wet Prep HPF POC NONE SEEN NONE SEEN   Trich, Wet Prep NONE SEEN NONE SEEN   Clue Cells Wet Prep HPF POC PRESENT (A) NONE SEEN   WBC, Wet Prep HPF POC MANY (A) NONE SEEN   Sperm NONE SEEN    US Ob Comp Less 14 Wks  09/10/2015  CLINICAL DATA:  Pelvic pain for 2 days. Quantitative beta HCG is pending. Estimated gestational age by LMP is 10 weeks 3 days. EXAM: OBSTETRIC <14 WK ULTRASOUND TECHNIQUE: Transabdominal ultrasound was performed for evaluation of the gestation as well as the maternal uterus and adnexal regions. COMPARISON:  None. FINDINGS: Intrauterine gestational sac: A single intrauterine pregnancy is identified. Yolk sac:  Yolk sac is not visualized. Embryo:  Fetal pole is present. Cardiac Activity: Fetal cardiac activity is observed. Heart Rate: 165 bpm CRL:   45.5  mm   11 w 2 d                  [place] EDC: 03/29/2013 Subchorionic hemorrhage:  None visualized. Maternal uterus/adnexae: Limited visualization of the uterus without focal lesion identified. Both ovaries are visualized and demonstrate normal follicular changes. No abnormal adnexal masses. No free fluid in the pelvis. IMPRESSION: Single intrauterine pregnancy. Estimated gestational age by crown-rump length is 11 weeks 2 days. No specific  acute complication is identified. Electronically Signed   By: Burman Nieves M.D.   On: 09/10/2015 22:49     MAU Course  Procedures  MDM   Assessment and Plan   1. Bacterial vaginal infection   2. Pelvic pain in pregnancy, antepartum, first trimester   3. [redacted] weeks gestation of pregnancy   4. Intrauterine pregnancy   5. Other iron deficiency anemias    DC home Comfort measures reviewed  1st/2nd/3rd Trimester precautions  Bleeding precautions Ectopic precautions PTL precautions  Fetal  kick counts RX: ferrous sulfate TID, Flagyl BID Return to MAU as needed FU with OB as planned  Follow-up Information    Schedule an appointment as soon as possible for a visit with [facility].   Contact information:   [address] [place] [address] [phone]          Tawnya Crook 09/10/2015, 10:47 PM

## 2015-09-10 NOTE — Discharge Instructions (Signed)
Safe Medications in Pregnancy   Acne: Benzoyl Peroxide Salicylic Acid  Backache/Headache: Tylenol: 2 regular strength every 4 hours OR              2 Extra strength every 6 hours  Colds/Coughs/Allergies: Benadryl (alcohol free) 25 mg every 6 hours as needed Breath right strips Claritin Cepacol throat lozenges Chloraseptic throat spray Cold-Eeze- up to three times per day Cough drops, alcohol free Flonase (by prescription only) Guaifenesin Mucinex Robitussin DM (plain only, alcohol free) Saline nasal spray/drops Sudafed (pseudoephedrine) & Actifed ** use only after [redacted] weeks gestation and if you do not have high blood pressure Tylenol Vicks Vaporub Zinc lozenges Zyrtec   Constipation: Colace Ducolax suppositories Fleet enema Glycerin suppositories Metamucil Milk of magnesia Miralax Senokot Smooth move tea  Diarrhea: Kaopectate Imodium A-D  *NO pepto Bismol  Hemorrhoids: Anusol Anusol HC Preparation H Tucks  Indigestion: Tums Maalox Mylanta Zantac  Pepcid  Insomnia: Benadryl (alcohol free) 25mg  every 6 hours as needed Tylenol PM Unisom, no Gelcaps  Leg Cramps: Tums MagGel  Nausea/Vomiting:  Bonine Dramamine Emetrol Ginger extract Sea bands Meclizine  Nausea medication to take during pregnancy:  Unisom (doxylamine succinate 25 mg tablets) Take one tablet daily at bedtime. If symptoms are not adequately controlled, the dose can be increased to a maximum recommended dose of two tablets daily (1/2 tablet in the morning, 1/2 tablet mid-afternoon and one at bedtime). Vitamin B6 100mg  tablets. Take one tablet twice a day (up to 200 mg per day).  Skin Rashes: Aveeno products Benadryl cream or 25mg  every 6 hours as needed Calamine Lotion 1% cortisone cream  Yeast infection: Gyne-lotrimin 7 Monistat 7   **If taking multiple medications, please check labels to avoid duplicating the same active ingredients **take medication as directed on  the label ** Do not exceed 4000 mg of tylenol in 24 hours **Do not take medications that contain aspirin or ibuprofen    Bacterial Vaginosis Bacterial vaginosis is a vaginal infection that occurs when the normal balance of bacteria in the vagina is disrupted. It results from an overgrowth of certain bacteria. This is the most common vaginal infection in women of childbearing age. Treatment is important to prevent complications, especially in pregnant women, as it can cause a premature delivery. CAUSES  Bacterial vaginosis is caused by an increase in harmful bacteria that are normally present in smaller amounts in the vagina. Several different kinds of bacteria can cause bacterial vaginosis. However, the reason that the condition develops is not fully understood. RISK FACTORS Certain activities or behaviors can put you at an increased risk of developing bacterial vaginosis, including:  Having a new sex partner or multiple sex partners.  Douching.  Using an intrauterine device (IUD) for contraception. Women do not get bacterial vaginosis from toilet seats, bedding, swimming pools, or contact with objects around them. SIGNS AND SYMPTOMS  Some women with bacterial vaginosis have no signs or symptoms. Common symptoms include:  Grey vaginal discharge.  A fishlike odor with discharge, especially after sexual intercourse.  Itching or burning of the vagina and vulva.  Burning or pain with urination. DIAGNOSIS  Your health care provider will take a medical history and examine the vagina for signs of bacterial vaginosis. A sample of vaginal fluid may be taken. Your health care provider will look at this sample under a microscope to check for bacteria and abnormal cells. A vaginal pH test may also be done.  TREATMENT  Bacterial vaginosis may be treated with antibiotic medicines. These  may be given in the form of a pill or a vaginal cream. A second round of antibiotics may be prescribed if the  condition comes back after treatment. Because bacterial vaginosis increases your risk for sexually transmitted diseases, getting treated can help reduce your risk for chlamydia, gonorrhea, HIV, and herpes. HOME CARE INSTRUCTIONS   Only take over-the-counter or prescription medicines as directed by your health care provider.  If antibiotic medicine was prescribed, take it as directed. Make sure you finish it even if you start to feel better.  Tell all sexual partners that you have a vaginal infection. They should see their health care provider and be treated if they have problems, such as a mild rash or itching.  During treatment, it is important that you follow these instructions:  Avoid sexual activity or use condoms correctly.  Do not douche.  Avoid alcohol as directed by your health care provider.  Avoid breastfeeding as directed by your health care provider. SEEK MEDICAL CARE IF:   Your symptoms are not improving after 3 days of treatment.  You have increased discharge or pain.  You have a fever. MAKE SURE YOU:   Understand these instructions.  Will watch your condition.  Will get help right away if you are not doing well or get worse. FOR MORE INFORMATION  Centers for Disease Control and Prevention, Division of STD Prevention: SolutionApps.[employer].za American Sexual Health Association (ASHA): www.ashastd.org    This information is not intended to replace advice given to you by your health care provider. Make sure you discuss any questions you have with your health care provider.   Document Released: 02/17/2005 Document Revised: 03/10/2014 Document Reviewed: 09/29/2012 Elsevier Interactive Patient Education [employer]

## 2015-09-11 LAB — RPR: RPR: NONREACTIVE

## 2015-09-11 LAB — HIV ANTIBODY (ROUTINE TESTING W REFLEX): HIV SCREEN 4TH GENERATION: NONREACTIVE

## 2015-09-11 LAB — GC/CHLAMYDIA PROBE AMP (~~LOC~~) NOT AT ARMC
CHLAMYDIA, DNA PROBE: NEGATIVE
Neisseria Gonorrhea: NEGATIVE

## 2015-10-11 ENCOUNTER — Encounter: Payer: Self-pay | Admitting: Advanced Practice Midwife

## 2015-10-11 ENCOUNTER — Ambulatory Visit (INDEPENDENT_AMBULATORY_CARE_PROVIDER_SITE_OTHER): Payer: BLUE CROSS/BLUE SHIELD | Admitting: Advanced Practice Midwife

## 2015-10-11 VITALS — BP 100/66 | HR 101 | Wt 112.0 lb

## 2015-10-11 DIAGNOSIS — Z3490 Encounter for supervision of normal pregnancy, unspecified, unspecified trimester: Secondary | ICD-10-CM | POA: Insufficient documentation

## 2015-10-11 DIAGNOSIS — O99612 Diseases of the digestive system complicating pregnancy, second trimester: Secondary | ICD-10-CM

## 2015-10-11 DIAGNOSIS — Z3492 Encounter for supervision of normal pregnancy, unspecified, second trimester: Secondary | ICD-10-CM

## 2015-10-11 DIAGNOSIS — K5900 Constipation, unspecified: Secondary | ICD-10-CM

## 2015-10-11 DIAGNOSIS — O99012 Anemia complicating pregnancy, second trimester: Secondary | ICD-10-CM

## 2015-10-11 DIAGNOSIS — O99019 Anemia complicating pregnancy, unspecified trimester: Secondary | ICD-10-CM | POA: Insufficient documentation

## 2015-10-11 DIAGNOSIS — D649 Anemia, unspecified: Secondary | ICD-10-CM

## 2015-10-11 DIAGNOSIS — Z1389 Encounter for screening for other disorder: Secondary | ICD-10-CM

## 2015-10-11 LAB — IRON,TIBC AND FERRITIN PANEL
%SAT: 5 % — AB (ref 11–50)
Ferritin: 7 ng/mL — ABNORMAL LOW (ref 10–154)
Iron: 23 ug/dL — ABNORMAL LOW (ref 40–190)
TIBC: 472 ug/dL — ABNORMAL HIGH (ref 250–450)

## 2015-10-11 MED ORDER — DOCUSATE SODIUM 100 MG PO CAPS: 100.0000 mg | ORAL_CAPSULE | Freq: Two times a day (BID) | ORAL | 3 refills | Status: DC | PRN

## 2015-10-11 NOTE — Patient Instructions (Addendum)
Iron-Rich Diet Iron is a mineral that helps your body to produce hemoglobin. Hemoglobin is a protein in your red blood cells that carries oxygen to your body's tissues. Eating too little iron may cause you to feel weak and tired, and it can increase your risk for infection. Eating enough iron is necessary for your body's metabolism, muscle function, and nervous system. Iron is naturally found in many foods. It can also be added to foods or fortified in foods. There are two types of dietary iron:  Heme iron. Heme iron is absorbed by the body more easily than nonheme iron. Heme iron is found in meat, poultry, and fish.  Nonheme iron. Nonheme iron is found in dietary supplements, iron-fortified grains, beans, and vegetables. You may need to follow an iron-rich diet if:  You have been diagnosed with iron deficiency or iron-deficiency anemia.  You have a condition that prevents you from absorbing dietary iron, such as:  Infection in your intestines.  Celiac disease. This involves long-lasting (chronic) inflammation of your intestines.  You do not eat enough iron.  You eat a diet that is high in foods that impair iron absorption.  You have lost a lot of blood.  You have heavy bleeding during your menstrual cycle.  You are pregnant. WHAT IS MY PLAN? Your health care provider may help you to determine how much iron you need per day based on your condition. Generally, when a person consumes sufficient amounts of iron in the diet, the following iron needs are met:  Men.  35-85 years old: 11 mg per day.  43-3 years old: 8 mg per day.  Women.   55-40 years old: 15 mg per day.  61-47 years old: 18 mg per day.  Over 53 years old: 8 mg per day.  Pregnant women: 27 mg per day.  Breastfeeding women: 9 mg per day. WHAT DO I NEED TO KNOW ABOUT AN IRON-RICH DIET?  Eat fresh fruits and vegetables that are high in vitamin C along with foods that are high in iron. This will help increase  the amount of iron that your body absorbs from food, especially with foods containing nonheme iron. Foods that are high in vitamin C include oranges, peppers, tomatoes, and mango.  Take iron supplements only as directed by your health care provider. Overdose of iron can be life-threatening. If you were prescribed iron supplements, take them with orange juice or a vitamin C supplement.  Cook foods in pots and pans that are made from iron.   Eat nonheme iron-containing foods alongside foods that are high in heme iron. This helps to improve your iron absorption.   Certain foods and drinks contain compounds that impair iron absorption. Avoid eating these foods in the same meal as iron-rich foods or with iron supplements. These include:  Coffee, black tea, and red wine.  Milk, dairy products, and foods that are high in calcium.  Beans, soybeans, and peas.  Whole grains.  When eating foods that contain both nonheme iron and compounds that impair iron absorption, follow these tips to absorb iron better.   Soak beans overnight before cooking.  Soak whole grains overnight and drain them before using.  Ferment flours before baking, such as using yeast in bread dough. WHAT FOODS CAN I EAT? Grains Iron-fortified breakfast cereal. Iron-fortified whole-wheat bread. Enriched rice. Sprouted grains. Vegetables Spinach. Potatoes with skin. Green peas. Broccoli. Red and green bell peppers. Fermented vegetables. Fruits Prunes. Raisins. Oranges. Strawberries. Mango. Grapefruit. Meats and Other Protein  Ferment flours before baking, such as using yeast in bread dough.  WHAT FOODS CAN I EAT?  Grains  Iron-fortified breakfast cereal. Iron-fortified whole-wheat bread. Enriched rice. Sprouted grains.  Vegetables  Spinach. Potatoes with skin. Green peas. Broccoli. Red and green bell peppers. Fermented vegetables.  Fruits  Prunes. Raisins. Oranges. Strawberries. Mango. Grapefruit.  Meats and Other Protein Sources  Beef liver. Oysters. Beef. Shrimp. Turkey. Chicken. Tuna. Sardines. Chickpeas. Nuts. Tofu.  Beverages  Tomato juice. Fresh orange juice. Prune juice. Hibiscus tea. Fortified instant breakfast shakes.  Condiments  Tahini. Fermented soy sauce.  Sweets and Desserts  Black-strap molasses.   Other  Wheat germ.  The items listed above may not be a complete list of recommended foods or  beverages. Contact your dietitian for more options.  WHAT FOODS ARE NOT RECOMMENDED?  Grains  Whole grains. Bran cereal. Bran flour. Oats.  Vegetables  Artichokes. Brussels sprouts. Kale.  Fruits  Blueberries. Raspberries. Strawberries. Figs.  Meats and Other Protein Sources  Soybeans. Products made from soy protein.  Dairy  Milk. Cream. Cheese. Yogurt. Cottage cheese.  Beverages  Coffee. Black tea. Red wine.  Sweets and Desserts  Cocoa. Chocolate. Ice cream.  Other  Basil. Oregano. Parsley.  The items listed above may not be a complete list of foods and beverages to avoid. Contact your dietitian for more information.     This information is not intended to replace advice given to you by your health care provider. Make sure you discuss any questions you have with your health care provider.     Document Released: 10/01/2004 Document Revised: 03/10/2014 Document Reviewed: 09/14/2013  Elsevier Interactive Patient Education 2016 Elsevier Inc.    Second Trimester of Pregnancy  The second trimester is from week 13 through week 28, months 4 through 6. The second trimester is often a time when you feel your best. Your body has also adjusted to being pregnant, and you begin to feel better physically. Usually, morning sickness has lessened or quit completely, you may have more energy, and you may have an increase in appetite. The second trimester is also a time when the fetus is growing rapidly. At the end of the sixth month, the fetus is about 9 inches long and weighs about 1 pounds. You will likely begin to feel the baby move (quickening) between 18 and 20 weeks of the pregnancy.  BODY CHANGES  Your body goes through many changes during pregnancy. The changes vary from woman to woman.    Your weight will continue to increase. You will notice your lower abdomen bulging out.   You may begin to get stretch marks on your hips, abdomen, and breasts.   You may develop headaches that can be relieved by medicines  approved by your health care provider.   You may urinate more often because the fetus is pressing on your bladder.   You may develop or continue to have heartburn as a result of your pregnancy.   You may develop constipation because certain hormones are causing the muscles that push waste through your intestines to slow down.   You may develop hemorrhoids or swollen, bulging veins (varicose veins).   You may have back pain because of the weight gain and pregnancy hormones relaxing your joints between the bones in your pelvis and as a result of a shift in weight and the muscles that support your balance.   Your breasts will continue to grow and be tender.   Your gums may bleed and may   be sensitive to brushing and flossing.   Dark spots or blotches (chloasma, mask of pregnancy) may develop on your face. This will likely fade after the baby is born.   A dark line from your belly button to the pubic area (linea nigra) may appear. This will likely fade after the baby is born.   You may have changes in your hair. These can include thickening of your hair, rapid growth, and changes in texture. Some women also have hair loss during or after pregnancy, or hair that feels dry or thin. Your hair will most likely return to normal after your baby is born.  WHAT TO EXPECT AT YOUR PRENATAL VISITS  During a routine prenatal visit:   You will be weighed to make sure you and the fetus are growing normally.   Your blood pressure will be taken.   Your abdomen will be measured to track your baby's growth.   The fetal heartbeat will be listened to.   Any test results from the previous visit will be discussed.  Your health care provider may ask you:   How you are feeling.   If you are feeling the baby move.   If you have had any abnormal symptoms, such as leaking fluid, bleeding, severe headaches, or abdominal cramping.   If you are using any tobacco products, including cigarettes, chewing tobacco, and electronic  cigarettes.   If you have any questions.  Other tests that may be performed during your second trimester include:   Blood tests that check for:    Low iron levels (anemia).    Gestational diabetes (between 24 and 28 weeks).    Rh antibodies.   Urine tests to check for infections, diabetes, or protein in the urine.   An ultrasound to confirm the proper growth and development of the baby.   An amniocentesis to check for possible genetic problems.   Fetal screens for spina bifida and Down syndrome.   HIV (human immunodeficiency virus) testing. Routine prenatal testing includes screening for HIV, unless you choose not to have this test.  HOME CARE INSTRUCTIONS    Avoid all smoking, herbs, alcohol, and unprescribed drugs. These chemicals affect the formation and growth of the baby.   Do not use any tobacco products, including cigarettes, chewing tobacco, and electronic cigarettes. If you need help quitting, ask your health care provider. You may receive counseling support and other resources to help you quit.   Follow your health care provider's instructions regarding medicine use. There are medicines that are either safe or unsafe to take during pregnancy.   Exercise only as directed by your health care provider. Experiencing uterine cramps is a good sign to stop exercising.   Continue to eat regular, healthy meals.   Wear a good support bra for breast tenderness.   Do not use hot tubs, steam rooms, or saunas.   Wear your seat belt at all times when driving.   Avoid raw meat, uncooked cheese, cat litter boxes, and soil used by cats. These carry germs that can cause birth defects in the baby.   Take your prenatal vitamins.   Take 1500-2000 mg of calcium daily starting at the 20th week of pregnancy until you deliver your baby.   Try taking a stool softener (if your health care provider approves) if you develop constipation. Eat more high-fiber foods, such as fresh vegetables or fruit and whole grains.  Drink plenty of fluids to keep your urine clear or pale yellow.     Take warm sitz baths to soothe any pain or discomfort caused by hemorrhoids. Use hemorrhoid cream if your health care provider approves.   If you develop varicose veins, wear support hose. Elevate your feet for 15 minutes, 3-4 times a day. Limit salt in your diet.   Avoid heavy lifting, wear low heel shoes, and practice good posture.   Rest with your legs elevated if you have leg cramps or low back pain.   Visit your dentist if you have not gone yet during your pregnancy. Use a soft toothbrush to brush your teeth and be gentle when you floss.   A sexual relationship may be continued unless your health care provider directs you otherwise.   Continue to go to all your prenatal visits as directed by your health care provider.  SEEK MEDICAL CARE IF:    You have dizziness.   You have mild pelvic cramps, pelvic pressure, or nagging pain in the abdominal area.   You have persistent nausea, vomiting, or diarrhea.   You have a bad smelling vaginal discharge.   You have pain with urination.  SEEK IMMEDIATE MEDICAL CARE IF:    You have a fever.   You are leaking fluid from your vagina.   You have spotting or bleeding from your vagina.   You have severe abdominal cramping or pain.   You have rapid weight gain or loss.   You have shortness of breath with chest pain.   You notice sudden or extreme swelling of your face, hands, ankles, feet, or legs.   You have not felt your baby move in over an hour.   You have severe headaches that do not go away with medicine.   You have vision changes.     This information is not intended to replace advice given to you by your health care provider. Make sure you discuss any questions you have with your health care provider.     Document Released: 02/11/2001 Document Revised: 03/10/2014 Document Reviewed: 04/20/2012  Elsevier Interactive Patient Education 2016 Elsevier Inc.

## 2015-10-11 NOTE — Progress Notes (Signed)
  Subjective:    Haley CoddingtonKieshawn Laroque is being seen today for her first obstetrical visit.  She is at 5471w5d gestation by 11 week US. Her obstetrical history is significant for nothing. Relationship with FOB: significant other, living together. Patient does intend to breast feed. Pregnancy history fully reviewed.  Was seen in [facility] 09/10/15. US Normal. Will keep dating by LMP due 6 day difference. GC/Chlmaydia neg. Hgb 8.9. Has Hx of anemia in previous pregnancy. [facility]. Eats almost all Fast Food.  Patient reports no complaints.  Review of Systems:   Review of Systems  Constitutional: Positive for fatigue. Negative for chills and fever.  Gastrointestinal: Negative for abdominal pain.  Genitourinary: Negative for vaginal bleeding and vaginal discharge.    Objective:     BP 100/66   Pulse (!) 101   Wt 112 lb (50.8 kg)   LMP 06/29/2015   BMI 21.87 kg/m  Physical Exam  Constitutional: She is oriented to person, place, and time. She appears well-developed and well-nourished. She appears distressed.  Eyes: Conjunctivae are normal.  Neck: No thyromegaly present.  Cardiovascular: Normal rate and normal heart sounds.   Respiratory: Effort normal.  GI: Soft. There is no tenderness.  Genitourinary:  Genitourinary Comments: Deferred due to recent exam  Musculoskeletal: Normal range of motion. She exhibits no edema or tenderness.  Neurological: She is alert and oriented to person, place, and time. She has normal reflexes.  Skin: Skin is warm and dry.  Psychiatric: She has a normal mood and affect.    Maternal Exam:  Abdomen: Patient reports no abdominal tenderness. Fundal height is 15 cm.    Cervix: not evaluated.   Fetal Exam Fetal Monitor Review: Mode: ultrasound.   Baseline rate: 152.      Assessment:    Pregnancy: G2P1001 Patient Active Problem List   Diagnosis Date Noted  . Supervision of normal pregnancy 10/11/2015  . Anemia affecting pregnancy,  antepartum 10/11/2015  . History of postpartum hemorrhage 11/01/2013     1. Supervision of normal pregnancy, second trimester  - US MFM OB DETAIL +14 WK; Future - Prenatal Profile - Hemoglobinopathy Evaluation - Culture, OB Urine - Pain Mgmt, Profile 6 Conf w/o mM, U  2. Constipation during pregnancy in second trimester  - docusate sodium (COLACE) 100 MG capsule; Take 1 capsule (100 mg total) by mouth 2 (two) times daily as needed.  Dispense: 30 capsule; Refill: 3  3. Encounter for routine screening for malformation using ultrasonics  - US MFM OB DETAIL +14 WK; Future  4. Anemia affecting pregnancy in second trimester, antepartum  - Iron, TIBC and Ferritin Panel - Hemoglobinopathy Evaluation -  May need Feraheme later in pregnancy. - Iron-rich diet. Strongly encouraged pt to reduce Fast Food and improve diet.     Plan:     Initial labs drawn. Prenatal vitamins. Problem list reviewed and updated. AFP3 discussed: Discussed. Role of ultrasound in pregnancy discussed; fetal survey: ordered. Amniocentesis discussed: not indicated. Follow up in 4 weeks.  Dorathy KinsmanVirginia Quavion Boule 10/11/2015

## 2015-10-12 LAB — PRENATAL PROFILE (SOLSTAS)
Antibody Screen: NEGATIVE
BASOS PCT: 0 %
Basophils Absolute: 0 cells/uL (ref 0–200)
EOS PCT: 1 %
Eosinophils Absolute: 71 cells/uL (ref 15–500)
HEMATOCRIT: 29 % — AB (ref 35.0–45.0)
HEMOGLOBIN: 9.1 g/dL — AB (ref 11.7–15.5)
HEP B S AG: NEGATIVE
HIV 1&2 Ab, 4th Generation: NONREACTIVE
LYMPHS ABS: 2059 {cells}/uL (ref 850–3900)
Lymphocytes Relative: 29 %
MCH: 23.8 pg — ABNORMAL LOW (ref 27.0–33.0)
MCHC: 31.4 g/dL — AB (ref 32.0–36.0)
MCV: 75.9 fL — AB (ref 80.0–100.0)
MPV: 9.2 fL (ref 7.5–12.5)
Monocytes Absolute: 497 cells/uL (ref 200–950)
Monocytes Relative: 7 %
NEUTROS ABS: 4473 {cells}/uL (ref 1500–7800)
Neutrophils Relative %: 63 %
Platelets: 322 10*3/uL (ref 140–400)
RBC: 3.82 MIL/uL (ref 3.80–5.10)
RDW: 18.1 % — ABNORMAL HIGH (ref 11.0–15.0)
Rh Type: POSITIVE
Rubella: 1.9 Index — ABNORMAL HIGH (ref <0.90)
WBC: 7.1 10*3/uL (ref 3.8–10.8)

## 2015-10-13 LAB — PAIN MGMT, PROFILE 6 CONF W/O MM, U
6 ACETYLMORPHINE: NEGATIVE ng/mL (ref <10)
AMPHETAMINES: NEGATIVE ng/mL (ref <500)
Alcohol Metabolites: NEGATIVE ng/mL (ref <500)
BARBITURATES: NEGATIVE ng/mL (ref <300)
Benzodiazepines: NEGATIVE ng/mL (ref <100)
Cocaine Metabolite: NEGATIVE ng/mL (ref <150)
Creatinine: 93.3 mg/dL (ref >=20.0)
METHADONE METABOLITE: NEGATIVE ng/mL (ref <100)
Marijuana Metabolite: NEGATIVE ng/mL (ref <20)
Opiates: NEGATIVE ng/mL (ref <100)
Oxidant: NEGATIVE ug/mL (ref <200)
Oxycodone: NEGATIVE ng/mL (ref <100)
PH: 7.89 (ref 4.5–9.0)
PHENCYCLIDINE: NEGATIVE ng/mL (ref <25)
Please note:: 0

## 2015-10-15 ENCOUNTER — Encounter: Payer: Self-pay | Admitting: *Deleted

## 2015-10-15 LAB — HEMOGLOBINOPATHY EVALUATION
HCT: 29 % — ABNORMAL LOW (ref 35.0–45.0)
HEMOGLOBIN: 9.1 g/dL — AB (ref 11.7–15.5)
HGB A2 QUANT: 2.1 % (ref 1.8–3.5)
Hgb A: 96.9 % (ref >96.0)
Hgb F Quant: <1 % (ref <2.0)
MCH: 23.8 pg — AB (ref 27.0–33.0)
MCV: 75.9 fL — AB (ref 80.0–100.0)
RBC: 3.82 MIL/uL (ref 3.80–5.10)
RDW: 18.1 % — AB (ref 11.0–15.0)

## 2015-10-30 ENCOUNTER — Encounter (HOSPITAL_COMMUNITY): Payer: Self-pay | Admitting: Advanced Practice Midwife

## 2015-11-07 ENCOUNTER — Ambulatory Visit (INDEPENDENT_AMBULATORY_CARE_PROVIDER_SITE_OTHER): Payer: BLUE CROSS/BLUE SHIELD | Admitting: Family Medicine

## 2015-11-07 VITALS — BP 97/63 | HR 78 | Wt 115.0 lb

## 2015-11-07 DIAGNOSIS — B009 Herpesviral infection, unspecified: Secondary | ICD-10-CM

## 2015-11-07 DIAGNOSIS — Z3482 Encounter for supervision of other normal pregnancy, second trimester: Secondary | ICD-10-CM | POA: Diagnosis not present

## 2015-11-07 DIAGNOSIS — O99012 Anemia complicating pregnancy, second trimester: Secondary | ICD-10-CM

## 2015-11-07 DIAGNOSIS — O98512 Other viral diseases complicating pregnancy, second trimester: Secondary | ICD-10-CM

## 2015-11-07 DIAGNOSIS — A6009 Herpesviral infection of other urogenital tract: Secondary | ICD-10-CM

## 2015-11-07 DIAGNOSIS — Z8759 Personal history of other complications of pregnancy, childbirth and the puerperium: Secondary | ICD-10-CM

## 2015-11-07 DIAGNOSIS — Z862 Personal history of diseases of the blood and blood-forming organs and certain disorders involving the immune mechanism: Secondary | ICD-10-CM

## 2015-11-07 DIAGNOSIS — D649 Anemia, unspecified: Secondary | ICD-10-CM

## 2015-11-07 DIAGNOSIS — Z3492 Encounter for supervision of normal pregnancy, unspecified, second trimester: Secondary | ICD-10-CM

## 2015-11-07 DIAGNOSIS — O99019 Anemia complicating pregnancy, unspecified trimester: Secondary | ICD-10-CM

## 2015-11-07 HISTORY — DX: Herpesviral infection, unspecified: B00.9

## 2015-11-07 LAB — CBC
HCT: 28.7 % — ABNORMAL LOW (ref 35.0–45.0)
Hemoglobin: 9.2 g/dL — ABNORMAL LOW (ref 11.7–15.5)
MCH: 24.7 pg — AB (ref 27.0–33.0)
MCHC: 32.1 g/dL (ref 32.0–36.0)
MCV: 76.9 fL — ABNORMAL LOW (ref 80.0–100.0)
MPV: 9.2 fL (ref 7.5–12.5)
PLATELETS: 343 10*3/uL (ref 140–400)
RBC: 3.73 MIL/uL — AB (ref 3.80–5.10)
RDW: 18.3 % — ABNORMAL HIGH (ref 11.0–15.0)
WBC: 5.7 10*3/uL (ref 3.8–10.8)

## 2015-11-07 MED ORDER — VALACYCLOVIR HCL 500 MG PO TABS: 500.0000 mg | ORAL_TABLET | Freq: Every day | ORAL | 6 refills | Status: DC

## 2015-11-07 MED ORDER — CONCEPT DHA 53.5-38-1 MG PO CAPS: 1.0000 | ORAL_CAPSULE | Freq: Every day | ORAL | 3 refills | Status: DC

## 2015-11-07 NOTE — Patient Instructions (Signed)

## 2015-11-07 NOTE — Progress Notes (Signed)
   PRENATAL VISIT NOTE  Subjective:  Haley Scott is a 20 y.o. G2P1001 at 3010w5d being seen today for ongoing prenatal care.  She is currently monitored for the following issues for this high-risk pregnancy and has History of postpartum hemorrhage; Supervision of normal pregnancy; and Anemia affecting pregnancy, antepartum on her problem list.  Patient reports fatigue and mild dizziness.  Contractions: Not present. Vag. Bleeding: None.  Movement: Present. Denies leaking of fluid.   The following portions of the patient's history were reviewed and updated as appropriate: allergies, current medications, past family history, past medical history, past social history, past surgical history and problem list. Problem list updated.  Objective:   Vitals:   11/07/15 0834  BP: 97/63  Pulse: 78  Weight: 115 lb (52.2 kg)    Fetal Status: Fetal Heart Rate (bpm): 157   Movement: Present     General:  Alert, oriented and cooperative. Patient is in no acute distress.  Skin: Skin is warm and dry. No rash noted.   Cardiovascular: Normal heart rate noted  Respiratory: Normal respiratory effort, no problems with respiration noted  Abdomen: Soft, gravid, appropriate for gestational age. Pain/Pressure: Absent     Pelvic:  Cervical exam deferred        Extremities: Normal range of motion.  Edema: None  Mental Status: Normal mood and affect. Normal behavior. Normal judgment and thought content.   Urinalysis:      Assessment and Plan:  Pregnancy: G2P1001 at 3110w5d  1. Supervision of normal pregnancy in second trimester - Doing well  - Prenat-FeFum-FePo-FA-Omega 3 (CONCEPT DHA) 53.5-38-1 MG CAPS; Take 1 tablet by mouth daily.  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  - valACYclovir (VALTREX) 500 MG tablet; Take 1 tablet (500 mg total) by mouth daily. Can increase to 2 tablets (1000mg ) twice daily for 7 days  during recurrence.  Dispense: 60 tablet; Refill: 6  3. Anemia affecting pregnancy, antepartum - Pt reports compliance with Fe for 2 months - CBC today - If continued low value recommend ferriheme infusion and will plan on outpatient infusion.   4. History of postpartum hemorrhage   Preterm labor symptoms and general obstetric precautions including but not limited to vaginal bleeding, contractions, leaking of fluid and fetal movement were reviewed in detail with the patient. Please refer to After Visit Summary for other counseling recommendations.   Return in 4 weeks (on 12/05/2015).  Federico FlakeKimberly Niles Luiscarlos Kaczmarczyk, MD

## 2015-11-09 ENCOUNTER — Ambulatory Visit (HOSPITAL_COMMUNITY)
Admission: RE | Admit: 2015-11-09 | Discharge: 2015-11-09 | Disposition: A | Discharge status: Home / Self Care | Payer: BLUE CROSS/BLUE SHIELD | Source: Ambulatory Visit | Attending: Advanced Practice Midwife | Admitting: Advanced Practice Midwife

## 2015-11-09 DIAGNOSIS — Z36 Encounter for antenatal screening of mother: Secondary | ICD-10-CM | POA: Insufficient documentation

## 2015-11-09 DIAGNOSIS — Z3A19 19 weeks gestation of pregnancy: Secondary | ICD-10-CM | POA: Insufficient documentation

## 2015-11-09 DIAGNOSIS — Z1389 Encounter for screening for other disorder: Secondary | ICD-10-CM

## 2015-11-09 DIAGNOSIS — Z3492 Encounter for supervision of normal pregnancy, unspecified, second trimester: Secondary | ICD-10-CM

## 2015-11-10 LAB — URINE CULTURE

## 2015-11-12 ENCOUNTER — Encounter: Payer: Self-pay | Admitting: Family Medicine

## 2015-11-12 ENCOUNTER — Telehealth: Payer: Self-pay | Admitting: *Deleted

## 2015-11-12 DIAGNOSIS — N390 Urinary tract infection, site not specified: Secondary | ICD-10-CM

## 2015-11-12 DIAGNOSIS — O2340 Unspecified infection of urinary tract in pregnancy, unspecified trimester: Secondary | ICD-10-CM | POA: Insufficient documentation

## 2015-11-12 HISTORY — DX: Unspecified infection of urinary tract in pregnancy, unspecified trimester: O23.40

## 2015-11-12 MED ORDER — CEPHALEXIN 500 MG PO CAPS: 500.0000 mg | ORAL_CAPSULE | Freq: Four times a day (QID) | ORAL | 0 refills | Status: DC

## 2015-11-12 NOTE — Telephone Encounter (Signed)
Informed pt of urine cx result and the need for antibiotic treatment, instructed pt on medication use.  Also informed pt of low Hgb and that we would be scheduling an IV Iron infusion and would call her with the appt date and time. Pt acknowledged.

## 2015-11-12 NOTE — Telephone Encounter (Signed)
-----   Message from Federico FlakeKimberly Niles Newton, MD sent at 11/12/2015  9:05 AM EDT ----- Patient has E Coli UTI. Please call in Kelfex 500mg  QID x7 days. Will need TOC after treatment. Additionally [facility]. She continues to have low hgb despite TID treatment.  Thanks,  Dr. Alvester MorinNewton

## 2015-11-13 ENCOUNTER — Telehealth: Payer: Self-pay | Admitting: *Deleted

## 2015-11-13 NOTE — Telephone Encounter (Signed)
Scheduled appt at [facility] for the IV Iron Infusion on 11-21-15 at 0800, notified pt of date and time.

## 2015-11-13 NOTE — Addendum Note (Signed)
Addended by: Jaynie CollinsANYANWU, Amedeo Detweiler A on: 11/13/2015 10:49 AM   Modules accepted: Orders

## 2015-11-17 ENCOUNTER — Encounter: Payer: Self-pay | Admitting: Advanced Practice Midwife

## 2015-11-17 DIAGNOSIS — O358XX0 Maternal care for other (suspected) fetal abnormality and damage, not applicable or unspecified: Secondary | ICD-10-CM | POA: Insufficient documentation

## 2015-11-17 DIAGNOSIS — O35BXX0 Maternal care for other (suspected) fetal abnormality and damage, fetal cardiac anomalies, not applicable or unspecified: Secondary | ICD-10-CM | POA: Insufficient documentation

## 2015-11-19 ENCOUNTER — Telehealth: Payer: Self-pay | Admitting: *Deleted

## 2015-11-19 NOTE — Telephone Encounter (Signed)
Attempted to call pt at number on file, unable to reach pt or leave message to inform her of US results and recommendation.  Message states the number is currently not a working number at this time.

## 2015-11-21 ENCOUNTER — Other Ambulatory Visit (INDEPENDENT_AMBULATORY_CARE_PROVIDER_SITE_OTHER): Payer: BLUE CROSS/BLUE SHIELD | Admitting: *Deleted

## 2015-11-21 ENCOUNTER — Telehealth: Payer: Self-pay | Admitting: *Deleted

## 2015-11-21 ENCOUNTER — Ambulatory Visit (HOSPITAL_COMMUNITY)
Admission: RE | Admit: 2015-11-21 | Discharge: 2015-11-21 | Disposition: A | Discharge status: Home / Self Care | Payer: BLUE CROSS/BLUE SHIELD | Source: Ambulatory Visit | Attending: Obstetrics & Gynecology | Admitting: Obstetrics & Gynecology

## 2015-11-21 DIAGNOSIS — Z3482 Encounter for supervision of other normal pregnancy, second trimester: Secondary | ICD-10-CM

## 2015-11-21 DIAGNOSIS — O99019 Anemia complicating pregnancy, unspecified trimester: Secondary | ICD-10-CM | POA: Insufficient documentation

## 2015-11-21 DIAGNOSIS — Z3A00 Weeks of gestation of pregnancy not specified: Secondary | ICD-10-CM | POA: Insufficient documentation

## 2015-11-21 DIAGNOSIS — Z3492 Encounter for supervision of normal pregnancy, unspecified, second trimester: Secondary | ICD-10-CM

## 2015-11-21 MED ORDER — SODIUM CHLORIDE 0.9 % IV SOLN
510.0000 mg | Freq: Once | INTRAVENOUS | Status: AC
  Administered 2015-11-21: 510 mg via INTRAVENOUS
  Filled 2015-11-21: qty 17

## 2015-11-21 NOTE — Progress Notes (Signed)
Pt had EIF noted on anatomy US, here today for quad screen.

## 2015-11-21 NOTE — Telephone Encounter (Signed)
Pt called back and is at [facility] for the Iron IV Infusion, will come in today for the quad screen due to the EIF noted on US.

## 2015-11-21 NOTE — Progress Notes (Signed)
Pt tolerated IV iron infusion well, VSS at DC, PT DC home without complaint.

## 2015-11-21 NOTE — Discharge Instructions (Signed)

## 2015-11-21 NOTE — Telephone Encounter (Signed)
-----   Message from [place], [place] sent at 11/17/2015  7:23 PM EDT ----- Inform pt of EIF and offer lab visit for quad before it is too late.

## 2015-11-21 NOTE — Telephone Encounter (Signed)
Called pt to inform her of result, unable to reach via phone, message stated pt unavailable. Called [facility] where pt was to be for an Iron IV Infusion, receptionist stated that pt no showed appt.

## 2015-11-22 LAB — AFP, QUAD SCREEN
AFP: 56 ng/mL
Age Alone: 1:1170 {titer}
Curr Gest Age: 21.6 wk
Down Syndrome Scr Risk Est: 1:1650 {titer}
HCG, Total: 13.25 [IU]/mL
INH: 322.5 pg/mL
Interpretation-AFP: NEGATIVE
MoM for AFP: 0.63
MoM for INH: 1.48
MoM for hCG: 0.57
Open Spina bifida: NEGATIVE
Osb Risk: <1:54600 {titer}
Tri 18 Scr Risk Est: NEGATIVE
Trisomy 18 (Edward) Syndrome Interp.: 1:15000 {titer}
uE3 Mom: 0.93
uE3 Value: 2.26 ng/mL

## 2015-12-04 ENCOUNTER — Ambulatory Visit (INDEPENDENT_AMBULATORY_CARE_PROVIDER_SITE_OTHER): Payer: BLUE CROSS/BLUE SHIELD | Admitting: Obstetrics & Gynecology

## 2015-12-04 VITALS — BP 107/65 | HR 101 | Wt 119.0 lb

## 2015-12-04 DIAGNOSIS — D649 Anemia, unspecified: Secondary | ICD-10-CM

## 2015-12-04 DIAGNOSIS — Z87440 Personal history of urinary (tract) infections: Secondary | ICD-10-CM

## 2015-12-04 DIAGNOSIS — O99012 Anemia complicating pregnancy, second trimester: Secondary | ICD-10-CM

## 2015-12-04 DIAGNOSIS — Z3482 Encounter for supervision of other normal pregnancy, second trimester: Secondary | ICD-10-CM

## 2015-12-04 NOTE — Progress Notes (Signed)
   PRENATAL VISIT NOTE  Subjective:  Haley Scott is a 20 y.o. G2P1001 at 2477w4d being seen today for ongoing prenatal care.  She is currently monitored for the following issues for this low-risk pregnancy and has History of postpartum hemorrhage; Supervision of normal pregnancy; Anemia affecting pregnancy, antepartum; HSV-2 (herpes simplex virus 2) infection; UTI in pregnancy, antepartum; and Echogenic focus of heart of fetus affecting antepartum care of mother on her problem list.  Patient reports fatigue that she attributes to continued anemia; had Feraheme infusion a few weeks ago. Desires 2nd dose. Feels this happened last pregnancy, was tired due to anemia.   Contractions: Not present. Vag. Bleeding: None.  Movement: Present. Denies leaking of fluid.   The following portions of the patient's history were reviewed and updated as appropriate: allergies, current medications, past family history, past medical history, past social history, past surgical history and problem list. Problem list updated.  Objective:   Vitals:   12/04/15 1552  BP: 107/65  Pulse: (!) 101  Weight: 119 lb (54 kg)    Fetal Status: Fetal Heart Rate (bpm): 150 Fundal Height: 23 cm Movement: Present     General:  Alert, oriented and cooperative. Patient is in no acute distress.  Skin: Skin is warm and dry. No rash noted.   Cardiovascular: Normal heart rate noted  Respiratory: Normal respiratory effort, no problems with respiration noted  Abdomen: Soft, gravid, appropriate for gestational age. Pain/Pressure: Absent     Pelvic:  Cervical exam deferred        Extremities: Normal range of motion.  Edema: None  Mental Status: Normal mood and affect. Normal behavior. Normal judgment and thought content.   Urinalysis: Urine Protein: Negative Urine Glucose: Negative  Assessment and Plan:  Pregnancy: G2P1001 at 6677w4d  1. Anemia complicating pregnancy in second trimester 2nd dose of Feraheme ordered, will check CBC at  next visit.  Patient encouraged to continue PNV and oral iron as prescribed.  - ferumoxytol (FERAHEME) 510 mg in sodium chloride 0.9 % 100 mL IVPB; Inject 510 mg into the vein once.  2. History of UTI Had E.coli UTI last month, TOC culture done today - CULTURE, URINE COMPREHENSIVE  3. Encounter for supervision of other normal pregnancy in second trimester Preterm labor symptoms and general obstetric precautions including but not limited to vaginal bleeding, contractions, leaking of fluid and fetal movement were reviewed in detail with the patient. Please refer to After Visit Summary for other counseling recommendations.  Return in about 4 weeks (around 01/01/2016) for OB Visit.  Tereso NewcomerUgonna A Marsean Elkhatib, MD

## 2015-12-04 NOTE — Patient Instructions (Addendum)
Return to clinic for any scheduled appointments or obstetric concerns, or go to MAU for evaluation  Contraception Choices Contraception (birth control) is the use of any methods or devices to prevent pregnancy. Below are some methods to help avoid pregnancy. HORMONAL METHODS   Contraceptive implant. This is a thin, plastic tube containing progesterone hormone. It does not contain estrogen hormone. Your health care provider inserts the tube in the inner part of the upper arm. The tube can remain in place for up to 3 years. After 3 years, the implant must be removed. The implant prevents the ovaries from releasing an egg (ovulation), thickens the cervical mucus to prevent sperm from entering the uterus, and thins the lining of the inside of the uterus.  Progesterone-only injections. These injections are given every 3 months by your health care provider to prevent pregnancy. This synthetic progesterone hormone stops the ovaries from releasing eggs. It also thickens cervical mucus and changes the uterine lining. This makes it harder for sperm to survive in the uterus.  Birth control pills. These pills contain estrogen and progesterone hormone. They work by preventing the ovaries from releasing eggs (ovulation). They also cause the cervical mucus to thicken, preventing the sperm from entering the uterus. Birth control pills are prescribed by a health care provider.Birth control pills can also be used to treat heavy periods.  Minipill. This type of birth control pill contains only the progesterone hormone. They are taken every day of each month and must be prescribed by your health care provider.  Birth control patch. The patch contains hormones similar to those in birth control pills. It must be changed once a week and is prescribed by a health care provider.  Vaginal ring. The ring contains hormones similar to those in birth control pills. It is left in the vagina for 3 weeks, removed for 1 week, and  then a new one is put back in place. The patient must be comfortable inserting and removing the ring from the vagina.A health care provider's prescription is necessary.  Emergency contraception. Emergency contraceptives prevent pregnancy after unprotected sexual intercourse. This pill can be taken right after sex or up to 5 days after unprotected sex. It is most effective the sooner you take the pills after having sexual intercourse. Most emergency contraceptive pills are available without a prescription. Check with your pharmacist. Do not use emergency contraception as your only form of birth control. BARRIER METHODS   Female condom. This is a thin sheath (latex or rubber) that is worn over the penis during sexual intercourse. It can be used with spermicide to increase effectiveness.  Female condom. This is a soft, loose-fitting sheath that is put into the vagina before sexual intercourse.  Diaphragm. This is a soft, latex, dome-shaped barrier that must be fitted by a health care provider. It is inserted into the vagina, along with a spermicidal jelly. It is inserted before intercourse. The diaphragm should be left in the vagina for 6 to 8 hours after intercourse.  Cervical cap. This is a round, soft, latex or plastic cup that fits over the cervix and must be fitted by a health care provider. The cap can be left in place for up to 48 hours after intercourse.  Sponge. This is a soft, circular piece of polyurethane foam. The sponge has spermicide in it. It is inserted into the vagina after wetting it and before sexual intercourse.  Spermicides. These are chemicals that kill or block sperm from entering the cervix and uterus.  They come in the form of creams, jellies, suppositories, foam, or tablets. They do not require a prescription. They are inserted into the vagina with an applicator before having sexual intercourse. The process must be repeated every time you have sexual intercourse. INTRAUTERINE  CONTRACEPTION  Intrauterine device (IUD). This is a T-shaped device that is put in a woman's uterus during a menstrual period to prevent pregnancy. There are 2 types:  Copper IUD. This type of IUD is wrapped in copper wire and is placed inside the uterus. Copper makes the uterus and fallopian tubes produce a fluid that kills sperm. It can stay in place for 10 years.  Hormone IUD. This type of IUD contains the hormone progestin (synthetic progesterone). The hormone thickens the cervical mucus and prevents sperm from entering the uterus, and it also thins the uterine lining to prevent implantation of a fertilized egg. The hormone can weaken or kill the sperm that get into the uterus. It can stay in place for 3-5 years, depending on which type of IUD is used. PERMANENT METHODS OF CONTRACEPTION  Female tubal ligation. This is when the woman's fallopian tubes are surgically sealed, tied, or blocked to prevent the egg from traveling to the uterus.  Hysteroscopic sterilization. This involves placing a small coil or insert into each fallopian tube. Your doctor uses a technique called hysteroscopy to do the procedure. The device causes scar tissue to form. This results in permanent blockage of the fallopian tubes, so the sperm cannot fertilize the egg. It takes about 3 months after the procedure for the tubes to become blocked. You must use another form of birth control for these 3 months.  Female sterilization. This is when the female has the tubes that carry sperm tied off (vasectomy).This blocks sperm from entering the vagina during sexual intercourse. After the procedure, the man can still ejaculate fluid (semen). NATURAL PLANNING METHODS  Natural family planning. This is not having sexual intercourse or using a barrier method (condom, diaphragm, cervical cap) on days the woman could become pregnant.  Calendar method. This is keeping track of the length of each menstrual cycle and identifying when you are  fertile.  Ovulation method. This is avoiding sexual intercourse during ovulation.  Symptothermal method. This is avoiding sexual intercourse during ovulation, using a thermometer and ovulation symptoms.  Post-ovulation method. This is timing sexual intercourse after you have ovulated. Regardless of which type or method of contraception you choose, it is important that you use condoms to protect against the transmission of sexually transmitted infections (STIs). Talk with your health care provider about which form of contraception is most appropriate for you.   This information is not intended to replace advice given to you by your health care provider. Make sure you discuss any questions you have with your health care provider.   Document Released: 02/17/2005 Document Revised: 02/22/2013 Document Reviewed: 08/12/2012 Elsevier Interactive Patient Education 2016 [employer]     [facility]  [facility] [address] [place], [place]  [address] Phone - [phone]   Fax - [phone]  [facility] [address] [place], [place] [address] Phone - [phone]   Fax - [phone]  [facility]ACK AMOS [address] [place], [place]  [address] Phone - [phone]   Fax - [phone]  [facility] [address] [place], [place]  [address] Phone - [phone]   Fax - [phone]   [facility] [address] [place], [place]  [address] Phone - [phone]   Fax - [phone]  [facility] [address] [place], [place]  [address] Phone - [phone]   Fax - [phone]  [facility] [address] [place], [place]  [address] Phone - [phone]   Fax - [phone]  [facility] AT [facility] [address] [place], [address] [place], [place]  [address] Phone - [phone]   Fax -  [phone]  [facility] AT [facility] [address] [place], [place]  [address] Phone - [phone]   Fax - [phone] [facility] [place] [address] [place], [place]  [address] Phone - [phone]   Fax - [phone]  [facility] [address] [place], [place]  [address] Phone - [phone]   Fax - [phone]  [facility] [address] [place], [place]  [address] Phone - [phone]   Fax - [phone]  [facility] [place] [address] [place], [place]  [address] Phone - [phone]   Fax - [phone]  [facility] [address] [place], [place]  [address] Phone - [phone]  [facility] [address] [place], [place]  [address] Phone - [phone]   Fax - [phone]  [facility] [address] [place], [place]  [address] Phone - [phone]   Fax - [phone]  [facility] [address] [place], [place]  [address] Phone - [phone]   Fax - [phone]  [facility] [address] [place], [place]  [address] Phone - [phone]   Fax - [phone]   [facility] [address] [place], [place]  [address] Phone - [phone]   Fax - [phone]  [facility] [address] [place], [place]  [address] Phone - [phone]   Fax - [phone]  DAVID RUBIN [address] [place], [place]  [address] Phone - [phone]   Fax - [phone]  [facility] [address] [place], [place]  [address] Phone - [phone]   Fax - [phone]  [facility] Alita Chyle [address] [place], [place]  [address] Phone - [phone]   Fax - [phone] Gerarda Fraction [address] [place], [place]  [address] Phone - [phone]   Fax -  [phone]  [facility] [address] [place], [place]  [address] Phone - [phone]   Fax - [phone]  [facility] - [place] [address] [place], [place]  [address] Phone - [phone]   Fax - [phone]

## 2015-12-05 ENCOUNTER — Other Ambulatory Visit: Payer: Self-pay | Admitting: Advanced Practice Midwife

## 2015-12-06 LAB — CULTURE, URINE COMPREHENSIVE: ORGANISM ID, BACTERIA: NO GROWTH

## 2015-12-10 ENCOUNTER — Ambulatory Visit (HOSPITAL_COMMUNITY)
Admission: RE | Admit: 2015-12-10 | Discharge: 2015-12-10 | Disposition: A | Discharge status: Home / Self Care | Payer: BLUE CROSS/BLUE SHIELD | Source: Ambulatory Visit | Attending: Obstetrics & Gynecology | Admitting: Obstetrics & Gynecology

## 2015-12-10 DIAGNOSIS — O99012 Anemia complicating pregnancy, second trimester: Secondary | ICD-10-CM | POA: Diagnosis not present

## 2015-12-10 DIAGNOSIS — Z3A00 Weeks of gestation of pregnancy not specified: Secondary | ICD-10-CM | POA: Insufficient documentation

## 2015-12-10 MED ORDER — SODIUM CHLORIDE 0.9 % IV SOLN
510.0000 mg | Freq: Once | INTRAVENOUS | Status: AC
  Administered 2015-12-10: 510 mg via INTRAVENOUS
  Filled 2015-12-10: qty 17

## 2015-12-10 NOTE — Progress Notes (Signed)
Pt came in today for schedukled Feraheme.  Pt tolerated feraheme well.  VSS were stable.

## 2016-01-02 ENCOUNTER — Ambulatory Visit (INDEPENDENT_AMBULATORY_CARE_PROVIDER_SITE_OTHER): Payer: BLUE CROSS/BLUE SHIELD | Admitting: Family Medicine

## 2016-01-02 VITALS — BP 103/64 | HR 81 | Wt 121.0 lb

## 2016-01-02 DIAGNOSIS — Z8759 Personal history of other complications of pregnancy, childbirth and the puerperium: Secondary | ICD-10-CM

## 2016-01-02 DIAGNOSIS — Z3402 Encounter for supervision of normal first pregnancy, second trimester: Secondary | ICD-10-CM

## 2016-01-02 DIAGNOSIS — O99019 Anemia complicating pregnancy, unspecified trimester: Secondary | ICD-10-CM

## 2016-01-02 DIAGNOSIS — Z862 Personal history of diseases of the blood and blood-forming organs and certain disorders involving the immune mechanism: Secondary | ICD-10-CM

## 2016-01-02 DIAGNOSIS — Z3403 Encounter for supervision of normal first pregnancy, third trimester: Secondary | ICD-10-CM

## 2016-01-02 DIAGNOSIS — O98312 Other infections with a predominantly sexual mode of transmission complicating pregnancy, second trimester: Secondary | ICD-10-CM

## 2016-01-02 DIAGNOSIS — B009 Herpesviral infection, unspecified: Secondary | ICD-10-CM

## 2016-01-02 DIAGNOSIS — A6009 Herpesviral infection of other urogenital tract: Secondary | ICD-10-CM

## 2016-01-02 DIAGNOSIS — O2340 Unspecified infection of urinary tract in pregnancy, unspecified trimester: Secondary | ICD-10-CM

## 2016-01-02 DIAGNOSIS — D649 Anemia, unspecified: Secondary | ICD-10-CM

## 2016-01-02 DIAGNOSIS — O99012 Anemia complicating pregnancy, second trimester: Secondary | ICD-10-CM

## 2016-01-02 DIAGNOSIS — O2342 Unspecified infection of urinary tract in pregnancy, second trimester: Secondary | ICD-10-CM

## 2016-01-02 NOTE — Patient Instructions (Signed)

## 2016-01-02 NOTE — Progress Notes (Signed)
   PRENATAL VISIT NOTE  Subjective:  Haley Scott is a 20 y.o. G2P1001 at 3839w5d being seen today for ongoing prenatal care.  She is currently monitored for the following issues for this low-risk pregnancy and has History of postpartum hemorrhage; Supervision of normal pregnancy; Anemia affecting pregnancy, antepartum; HSV-2 (herpes simplex virus 2) infection; UTI in pregnancy, antepartum; and Echogenic focus of heart of fetus affecting antepartum care of mother on her problem list.  Patient reports fatigue- reports compliance with iron therapy.  Contractions: Not present. Vag. Bleeding: None.  Movement: Present. Denies leaking of fluid.   The following portions of the patient's history were reviewed and updated as appropriate: allergies, current medications, past family history, past medical history, past social history, past surgical history and problem list. Problem list updated.  Objective:   Vitals:   01/02/16 0913  BP: 103/64  Pulse: 81  Weight: 121 lb (54.9 kg)    Fetal Status: Fetal Heart Rate (bpm): 152   Movement: Present     General:  Alert, oriented and cooperative. Patient is in no acute distress.  Skin: Skin is warm and dry. No rash noted.   Cardiovascular: Normal heart rate noted  Respiratory: Normal respiratory effort, no problems with respiration noted  Abdomen: Soft, gravid, appropriate for gestational age. Pain/Pressure: Absent     Pelvic:  Cervical exam deferred        Extremities: Normal range of motion.  Edema: None  Mental Status: Normal mood and affect. Normal behavior. Normal judgment and thought content.   Assessment and Plan:  Pregnancy: G2P1001 at 6039w5d  1. History of postpartum hemorrhage -high risk for PPH  2. UTI in pregnancy, antepartum TOC negative, no sx today  3. Anemia affecting pregnancy, antepartum Taking iron, will have CBC at next visit, consider feraheme infusion if continued low to maximize hgb prior to delivery Lab Results    Component Value Date   HGB 9.2 (L) 11/07/2015   HGB 9.1 (L) 10/11/2015   HGB 9.1 (L) 10/11/2015   4. HSV-2 (herpes simplex virus 2) infection Start Pppx at 36 weeks  5. Encounter for supervision of normal first pregnancy in third trimester UTD with care, updated box Third trimester labs next visit  Preterm labor symptoms and general obstetric precautions including but not limited to vaginal bleeding, contractions, leaking of fluid and fetal movement were reviewed in detail with the patient. Please refer to After Visit Summary for other counseling recommendations.   Return in about 2 weeks (around 01/16/2016) for Routine prenatal care- 1 hr gtt.  Federico FlakeKimberly Niles Nasri Boakye, MD

## 2016-01-04 ENCOUNTER — Other Ambulatory Visit: Payer: Self-pay | Admitting: Family Medicine

## 2016-01-04 DIAGNOSIS — Z3482 Encounter for supervision of other normal pregnancy, second trimester: Secondary | ICD-10-CM

## 2016-01-04 MED ORDER — CONCEPT DHA 53.5-38-1 MG PO CAPS: 1.0000 | ORAL_CAPSULE | Freq: Every day | ORAL | 3 refills | Status: DC

## 2016-01-18 ENCOUNTER — Ambulatory Visit (INDEPENDENT_AMBULATORY_CARE_PROVIDER_SITE_OTHER): Payer: BLUE CROSS/BLUE SHIELD | Admitting: Obstetrics and Gynecology

## 2016-01-18 VITALS — BP 99/63 | HR 99 | Wt 124.0 lb

## 2016-01-18 DIAGNOSIS — O99013 Anemia complicating pregnancy, third trimester: Secondary | ICD-10-CM

## 2016-01-18 DIAGNOSIS — Z3482 Encounter for supervision of other normal pregnancy, second trimester: Secondary | ICD-10-CM

## 2016-01-18 DIAGNOSIS — R350 Frequency of micturition: Secondary | ICD-10-CM

## 2016-01-18 DIAGNOSIS — E669 Obesity, unspecified: Secondary | ICD-10-CM

## 2016-01-18 DIAGNOSIS — B009 Herpesviral infection, unspecified: Secondary | ICD-10-CM

## 2016-01-18 DIAGNOSIS — O99019 Anemia complicating pregnancy, unspecified trimester: Secondary | ICD-10-CM

## 2016-01-18 LAB — CBC
HEMATOCRIT: 32.7 % — AB (ref 35.0–45.0)
Hemoglobin: 10.8 g/dL — ABNORMAL LOW (ref 11.7–15.5)
MCH: 27.5 pg (ref 27.0–33.0)
MCHC: 33 g/dL (ref 32.0–36.0)
MCV: 83.2 fL (ref 80.0–100.0)
MPV: 9.6 fL (ref 7.5–12.5)
PLATELETS: 247 10*3/uL (ref 140–400)
RBC: 3.93 MIL/uL (ref 3.80–5.10)
RDW: 18 % — AB (ref 11.0–15.0)
WBC: 7.6 10*3/uL (ref 3.8–10.8)

## 2016-01-18 MED ORDER — CONCEPT DHA 53.5-38-1 MG PO CAPS: 1.0000 | ORAL_CAPSULE | Freq: Every day | ORAL | 3 refills | Status: DC

## 2016-01-18 MED ORDER — FERROUS SULFATE 325 (65 FE) MG PO TABS: 325.0000 mg | ORAL_TABLET | Freq: Three times a day (TID) | ORAL | 1 refills | Status: DC

## 2016-01-18 NOTE — Addendum Note (Signed)
Addended by: Anell BarrHOWARD, JENNIFER L on: 01/18/2016 10:27 AM   Modules accepted: Orders

## 2016-01-18 NOTE — Addendum Note (Signed)
Addended by: Anell BarrHOWARD, Danitza Schoenfeldt L on: 01/18/2016 10:36 AM   Modules accepted: Orders

## 2016-01-18 NOTE — Progress Notes (Signed)
Prenatal Visit Note Date: 01/18/2016 [facility]  Subjective:  Haley Scott is a 20 y.o. G2P1001 at 8895w0d being seen today for ongoing prenatal care.  She is currently monitored for the following issues for this low-risk pregnancy and has History of postpartum hemorrhage; Supervision of normal pregnancy; Anemia affecting pregnancy, antepartum; HSV-2 (herpes simplex virus 2) infection; UTI in pregnancy, antepartum; and Echogenic focus of heart of fetus affecting antepartum care of mother on her problem list.  Patient reports no complaints.   Contractions: Not present. Vag. Bleeding: None.  Movement: Present. Denies leaking of fluid.   The following portions of the patient's history were reviewed and updated as appropriate: allergies, current medications, past family history, past medical history, past social history, past surgical history and problem list. Problem list updated.  Objective:   Vitals:   01/18/16 0920  BP: 99/63  Pulse: 99  Weight: 124 lb (56.2 kg)    Fetal Status: Fetal Heart Rate (bpm): 144 Fundal Height: 29 cm Movement: Present     General:  Alert, oriented and cooperative. Patient is in no acute distress.  Skin: Skin is warm and dry. No rash noted.   Cardiovascular: Normal heart rate noted  Respiratory: Normal respiratory effort, no problems with respiration noted  Abdomen: Soft, gravid, appropriate for gestational age. Pain/Pressure: Absent     Pelvic:  Cervical exam deferred        Extremities: Normal range of motion.  Edema: None  Mental Status: Normal mood and affect. Normal behavior. Normal judgment and thought content.   Urinalysis:      Assessment and Plan:  Pregnancy: G2P1001 at 995w0d  1. Encounter for supervision of other normal pregnancy in second trimester Pt ate today so will do 1hr. D/w pt that weight gain is good and she's about half way there.  - Glucose Tolerance, 1 HR (50g) - RPR - Prenat-FeFum-FePo-FA-Omega 3  (CONCEPT DHA) 53.5-38-1 MG CAPS; Take 1 tablet by mouth daily.  Dispense: 90 capsule; Refill: 3 - CBC - HIV antibody (with reflex)  2. HSV-2 (herpes simplex virus 2) infection ppx at 35-36wks  3. Anemia affecting pregnancy, antepartum F/u cbc from today  Preterm labor symptoms and general obstetric precautions including but not limited to vaginal bleeding, contractions, leaking of fluid and fetal movement were reviewed in detail with the patient. Please refer to After Visit Summary for other counseling recommendations.  Return in about 2 weeks (around 02/01/2016).   Oak Bingharlie Mallorie Norrod, MD

## 2016-01-19 LAB — GLUCOSE TOLERANCE, 1 HOUR (50G) W/O FASTING: Glucose, 1 Hr, gestational: 76 mg/dL (ref <140)

## 2016-01-19 LAB — HIV ANTIBODY (ROUTINE TESTING W REFLEX): HIV: NONREACTIVE

## 2016-01-19 LAB — RPR

## 2016-01-21 ENCOUNTER — Encounter: Payer: Self-pay | Admitting: *Deleted

## 2016-01-21 ENCOUNTER — Telehealth: Payer: Self-pay | Admitting: *Deleted

## 2016-01-21 DIAGNOSIS — O2343 Unspecified infection of urinary tract in pregnancy, third trimester: Secondary | ICD-10-CM

## 2016-01-21 LAB — CULTURE, URINE COMPREHENSIVE

## 2016-01-21 MED ORDER — CEPHALEXIN 500 MG PO CAPS: 500.0000 mg | ORAL_CAPSULE | Freq: Four times a day (QID) | ORAL | 0 refills | Status: DC

## 2016-01-21 NOTE — Telephone Encounter (Signed)
-----   Message from Carrie L Hillman, RN sent at 01/21/2016  3:04 PM EST -----   ----- Message ----- From: Charlie Pickens, MD Sent: 01/21/2016   8:36 AM To: [facility]  It looks like she has another uti. Can you call her in some keflex 500mg po q6h x 5d? thanks 

## 2016-01-21 NOTE — Telephone Encounter (Signed)
Called pt, no answer, left message to call the office.  

## 2016-01-21 NOTE — Telephone Encounter (Signed)
Informed pt of urine cx result and the need for treatment, sent rx to pharmacy and instructed on medication use.

## 2016-01-21 NOTE — Telephone Encounter (Signed)
-----   Message from Kathee Deltonarrie L Hillman, RN sent at 01/21/2016  3:04 PM EST -----   ----- Message ----- From: Harrison Bingharlie Pickens, MD Sent: 01/21/2016   8:36 AM To: [facility]  It looks like she has another uti. Can you call her in some keflex 500mg  po q6h x 5d? thanks

## 2016-02-01 ENCOUNTER — Encounter: Payer: BLUE CROSS/BLUE SHIELD | Admitting: Obstetrics & Gynecology

## 2016-02-05 ENCOUNTER — Encounter: Payer: BLUE CROSS/BLUE SHIELD | Admitting: Family Medicine

## 2016-02-07 ENCOUNTER — Ambulatory Visit (INDEPENDENT_AMBULATORY_CARE_PROVIDER_SITE_OTHER): Payer: BLUE CROSS/BLUE SHIELD | Admitting: Obstetrics & Gynecology

## 2016-02-07 VITALS — BP 104/64 | HR 86 | Wt 128.0 lb

## 2016-02-07 DIAGNOSIS — A6009 Herpesviral infection of other urogenital tract: Secondary | ICD-10-CM

## 2016-02-07 DIAGNOSIS — O98313 Other infections with a predominantly sexual mode of transmission complicating pregnancy, third trimester: Secondary | ICD-10-CM

## 2016-02-07 DIAGNOSIS — B009 Herpesviral infection, unspecified: Secondary | ICD-10-CM

## 2016-02-07 DIAGNOSIS — Z3403 Encounter for supervision of normal first pregnancy, third trimester: Secondary | ICD-10-CM

## 2016-02-07 NOTE — Progress Notes (Signed)
   PRENATAL VISIT NOTE  Subjective:  Haley Scott is a 20 y.o. S AA G2P1001 at 817w6d being seen today for ongoing prenatal care.  She is currently monitored for the following issues for this low-risk pregnancy and has History of postpartum hemorrhage; Supervision of normal pregnancy; Anemia affecting pregnancy, antepartum; HSV-2 (herpes simplex virus 2) infection; UTI in pregnancy, antepartum; and Echogenic focus of heart of fetus affecting antepartum care of mother on her problem list.  Patient reports no complaints.  Contractions: Not present. Vag. Bleeding: None.  Movement: Present. Denies leaking of fluid.   The following portions of the patient's history were reviewed and updated as appropriate: allergies, current medications, past family history, past medical history, past social history, past surgical history and problem list. Problem list updated.  Objective:   Vitals:   02/07/16 0837  BP: 104/64  Pulse: 86  Weight: 128 lb (58.1 kg)    Fetal Status: Fetal Heart Rate (bpm): 143   Movement: Present     General:  Alert, oriented and cooperative. Patient is in no acute distress.  Skin: Skin is warm and dry. No rash noted.   Cardiovascular: Normal heart rate noted  Respiratory: Normal respiratory effort, no problems with respiration noted  Abdomen: Soft, gravid, appropriate for gestational age. Pain/Pressure: Present     Pelvic:  Cervical exam deferred        Extremities: Normal range of motion.  Edema: Trace  Mental Status: Normal mood and affect. Normal behavior. Normal judgment and thought content.   Assessment and Plan:  Pregnancy: G2P1001 at 667w6d  1. Encounter for supervision of normal first pregnancy in third trimester   2. HSV-2 (herpes simplex virus 2) infection   Preterm labor symptoms and general obstetric precautions including but not limited to vaginal bleeding, contractions, leaking of fluid and fetal movement were reviewed in detail with the patient. Please  refer to After Visit Summary for other counseling recommendations.  No Follow-up on file.   Allie BossierMyra C Kaytelyn Glore, MD

## 2016-02-21 ENCOUNTER — Ambulatory Visit (INDEPENDENT_AMBULATORY_CARE_PROVIDER_SITE_OTHER): Payer: BLUE CROSS/BLUE SHIELD | Admitting: Obstetrics and Gynecology

## 2016-02-21 VITALS — BP 98/62 | HR 93 | Wt 129.0 lb

## 2016-02-21 DIAGNOSIS — D649 Anemia, unspecified: Secondary | ICD-10-CM

## 2016-02-21 DIAGNOSIS — A6009 Herpesviral infection of other urogenital tract: Secondary | ICD-10-CM

## 2016-02-21 DIAGNOSIS — O98313 Other infections with a predominantly sexual mode of transmission complicating pregnancy, third trimester: Secondary | ICD-10-CM

## 2016-02-21 DIAGNOSIS — O2343 Unspecified infection of urinary tract in pregnancy, third trimester: Secondary | ICD-10-CM

## 2016-02-21 DIAGNOSIS — O99019 Anemia complicating pregnancy, unspecified trimester: Secondary | ICD-10-CM

## 2016-02-21 DIAGNOSIS — B009 Herpesviral infection, unspecified: Secondary | ICD-10-CM

## 2016-02-21 DIAGNOSIS — O99013 Anemia complicating pregnancy, third trimester: Secondary | ICD-10-CM

## 2016-02-21 DIAGNOSIS — Z3403 Encounter for supervision of normal first pregnancy, third trimester: Secondary | ICD-10-CM

## 2016-02-21 DIAGNOSIS — O2340 Unspecified infection of urinary tract in pregnancy, unspecified trimester: Secondary | ICD-10-CM

## 2016-02-21 NOTE — Progress Notes (Signed)
Prenatal Visit Note Date: 02/21/2016 Clinic: [facility]  Subjective:  Joanie CoddingtonKieshawn Bednarski is a 20 y.o. G2P1001 at 548w6d being seen today for ongoing prenatal care.  She is currently monitored for the following issues for this low-risk pregnancy and has History of postpartum hemorrhage; Supervision of normal pregnancy; Anemia affecting pregnancy, antepartum; HSV-2 (herpes simplex virus 2) infection; UTI in pregnancy, antepartum; and Echogenic focus of heart of fetus affecting antepartum care of mother on her problem list.  Patient reports no complaints.   Contractions: Not present. Vag. Bleeding: None.  Movement: Present. Denies leaking of fluid.   The following portions of the patient's history were reviewed and updated as appropriate: allergies, current medications, past family history, past medical history, past social history, past surgical history and problem list. Problem list updated.  Objective:   Vitals:   02/21/16 1023  BP: 98/62  Pulse: 93  Weight: 129 lb (58.5 kg)    Fetal Status: Fetal Heart Rate (bpm): 141 Fundal Height: 33 cm Movement: Present  Presentation: Vertex  General:  Alert, oriented and cooperative. Patient is in no acute distress.  Skin: Skin is warm and dry. No rash noted.   Cardiovascular: Normal heart rate noted  Respiratory: Normal respiratory effort, no problems with respiration noted  Abdomen: Soft, gravid, appropriate for gestational age. Pain/Pressure: Present     Pelvic:  Cervical exam deferred        Extremities: Normal range of motion.  Edema: None  Mental Status: Normal mood and affect. Normal behavior. Normal judgment and thought content.   Urinalysis:      Assessment and Plan:  Pregnancy: G2P1001 at 868w6d  1. Encounter for supervision of normal first pregnancy in third trimester Routine care. GBS nv.   2. HSV-2 (herpes simplex virus 2) infection D/w pt to start valtrex ppx starting nv  3. UTI in pregnancy,  antepartum ucx toc nv  4. Anemia affecting pregnancy, antepartum Pt told okay to go down to bid with PNV; recommended she add OJ with po iron intake. Pt s/p iv iron x 2 this pregnancy.   Preterm labor symptoms and general obstetric precautions including but not limited to vaginal bleeding, contractions, leaking of fluid and fetal movement were reviewed in detail with the patient. Please refer to After Visit Summary for other counseling recommendations.  Return in about 2 weeks (around 03/06/2016).   [place] Bingharlie Arantxa Piercey, MD

## 2016-03-03 NOTE — L&D Delivery Note (Signed)
Delivery Note  Patient induced for suspected abruption, though during induction no significant vaginal bleeding or fetal heart rate abnormalities. Induced with foley, pitocin, and arom.  At 5:06 PM a viable female was delivered via  (Presentation: oa ).  APGAR: pending ; weight pending .   Placenta status: intact .  Cord: 3v. with the following complications: see below  Cord pH: not obtained  Greater than average bleeding immediately PP, ebl 300. Fundus firm, no lacerations, placenta delivered easily and appears intact. Will continue pitocin and give methergine x1 IM and monitor closely.  Anesthesia:  none Episiotomy:  none Lacerations:  none Est. Blood Loss (mL):  300 at time of signing this note  Mom to postpartum.  Baby to Couplet care / Skin to Skin.  Cherrie Gauzeoah B Molly Savarino 04/08/2016, 5:18 PM

## 2016-03-06 ENCOUNTER — Encounter: Payer: BLUE CROSS/BLUE SHIELD | Admitting: Student

## 2016-03-13 ENCOUNTER — Encounter: Payer: BLUE CROSS/BLUE SHIELD | Admitting: Obstetrics & Gynecology

## 2016-03-20 ENCOUNTER — Telehealth: Payer: Self-pay | Admitting: Radiology

## 2016-03-20 ENCOUNTER — Encounter: Payer: BLUE CROSS/BLUE SHIELD | Admitting: Obstetrics and Gynecology

## 2016-03-20 NOTE — Telephone Encounter (Signed)
Office closed due to snow called pt to reschedule no answer unable to leave message, voicemail not set up

## 2016-03-27 ENCOUNTER — Other Ambulatory Visit (HOSPITAL_COMMUNITY)
Admission: RE | Admit: 2016-03-27 | Discharge: 2016-03-27 | Disposition: A | Discharge status: Home / Self Care | Payer: BLUE CROSS/BLUE SHIELD | Source: Ambulatory Visit | Attending: Obstetrics and Gynecology | Admitting: Obstetrics and Gynecology

## 2016-03-27 ENCOUNTER — Ambulatory Visit (INDEPENDENT_AMBULATORY_CARE_PROVIDER_SITE_OTHER): Payer: BLUE CROSS/BLUE SHIELD | Admitting: Obstetrics and Gynecology

## 2016-03-27 VITALS — BP 103/67 | HR 93 | Wt 130.0 lb

## 2016-03-27 DIAGNOSIS — B009 Herpesviral infection, unspecified: Secondary | ICD-10-CM

## 2016-03-27 DIAGNOSIS — Z113 Encounter for screening for infections with a predominantly sexual mode of transmission: Secondary | ICD-10-CM | POA: Insufficient documentation

## 2016-03-27 DIAGNOSIS — O2340 Unspecified infection of urinary tract in pregnancy, unspecified trimester: Secondary | ICD-10-CM

## 2016-03-27 DIAGNOSIS — O2343 Unspecified infection of urinary tract in pregnancy, third trimester: Secondary | ICD-10-CM

## 2016-03-27 DIAGNOSIS — Z3403 Encounter for supervision of normal first pregnancy, third trimester: Secondary | ICD-10-CM

## 2016-03-27 LAB — OB RESULTS CONSOLE GC/CHLAMYDIA: GC PROBE AMP, GENITAL: NEGATIVE

## 2016-03-27 LAB — OB RESULTS CONSOLE GBS: STREP GROUP B AG: NEGATIVE

## 2016-03-27 NOTE — Progress Notes (Signed)
Prenatal Visit Note Date: 03/27/2016 Clinic: [facility]  Subjective:  Haley Scott is a 21 y.o. G2P1001 at 4475w6d being seen today for ongoing prenatal care.  She is currently monitored for the following issues for this low-risk pregnancy and has History of postpartum hemorrhage; Supervision of normal pregnancy; Anemia affecting pregnancy, antepartum; HSV-2 (herpes simplex virus 2) infection; UTI in pregnancy, antepartum; and Echogenic focus of heart of fetus affecting antepartum care of mother on her problem list.  Patient reports no complaints.   Contractions: Not present. Vag. Bleeding: None.  Movement: Present. Denies leaking of fluid.   The following portions of the patient's history were reviewed and updated as appropriate: allergies, current medications, past family history, past medical history, past social history, past surgical history and problem list. Problem list updated.  Objective:   Vitals:   03/27/16 1119  BP: 103/67  Pulse: 93  Weight: 130 lb (59 kg)    Fetal Status: Fetal Heart Rate (bpm): 140 Fundal Height: 39 cm Movement: Present  Presentation: Vertex  General:  Alert, oriented and cooperative. Patient is in no acute distress.  Skin: Skin is warm and dry. No rash noted.   Cardiovascular: Normal heart rate noted  Respiratory: Normal respiratory effort, no problems with respiration noted  Abdomen: Soft, gravid, appropriate for gestational age. Pain/Pressure: Present     Pelvic:  Cervical exam performed Dilation: Fingertip Effacement (%): Thick Station: Ballotable (patient desired)  Extremities: Normal range of motion.  Edema: None  Mental Status: Normal mood and affect. Normal behavior. Normal judgment and thought content.   Urinalysis:      Assessment and Plan:  Pregnancy: G2P1001 at 8775w6d  1. Encounter for supervision of normal first pregnancy in third trimester Routine care. Paragard. Pt told to call after delivery to let us know so we  can order it for PP visit - Culture, beta strep (group b only) - GC[facility]Chlamydia probe amp [facility]  2. HSV-2 (herpes simplex virus 2) infection Taking valtrex already. No external lesions today  3. UTI in pregnancy, antepartum toc today - Urine Culture  Term labor symptoms and general obstetric precautions including but not limited to vaginal bleeding, contractions, leaking of fluid and fetal movement were reviewed in detail with the patient. Please refer to After Visit Summary for other counseling recommendations.  Return in about 1 week (around 04/03/2016) for rob. no need for nst or afi.Rankin Bing.   Slyvia Lartigue, MD

## 2016-03-28 LAB — GC/CHLAMYDIA PROBE AMP (~~LOC~~) NOT AT ARMC
CHLAMYDIA, DNA PROBE: NEGATIVE
NEISSERIA GONORRHEA: NEGATIVE

## 2016-03-29 LAB — URINE CULTURE: Organism ID, Bacteria: NO GROWTH

## 2016-03-31 LAB — CULTURE, BETA STREP (GROUP B ONLY): STREP GP B CULTURE: NEGATIVE

## 2016-04-03 ENCOUNTER — Inpatient Hospital Stay (HOSPITAL_COMMUNITY)
Admission: AD | Admit: 2016-04-03 | Discharge: 2016-04-03 | Disposition: A | Discharge status: Home / Self Care | Payer: BLUE CROSS/BLUE SHIELD | Source: Ambulatory Visit | Attending: Obstetrics and Gynecology | Admitting: Obstetrics and Gynecology

## 2016-04-03 ENCOUNTER — Encounter (HOSPITAL_COMMUNITY): Payer: Self-pay | Admitting: *Deleted

## 2016-04-03 ENCOUNTER — Encounter: Payer: Self-pay | Admitting: Certified Nurse Midwife

## 2016-04-03 DIAGNOSIS — O9989 Other specified diseases and conditions complicating pregnancy, childbirth and the puerperium: Secondary | ICD-10-CM | POA: Diagnosis not present

## 2016-04-03 DIAGNOSIS — Z3689 Encounter for other specified antenatal screening: Secondary | ICD-10-CM

## 2016-04-03 DIAGNOSIS — O98313 Other infections with a predominantly sexual mode of transmission complicating pregnancy, third trimester: Secondary | ICD-10-CM | POA: Diagnosis not present

## 2016-04-03 DIAGNOSIS — Z888 Allergy status to other drugs, medicaments and biological substances status: Secondary | ICD-10-CM | POA: Insufficient documentation

## 2016-04-03 DIAGNOSIS — Z3403 Encounter for supervision of normal first pregnancy, third trimester: Secondary | ICD-10-CM

## 2016-04-03 DIAGNOSIS — N898 Other specified noninflammatory disorders of vagina: Secondary | ICD-10-CM | POA: Diagnosis present

## 2016-04-03 DIAGNOSIS — A5901 Trichomonal vulvovaginitis: Secondary | ICD-10-CM

## 2016-04-03 DIAGNOSIS — Z3A39 39 weeks gestation of pregnancy: Secondary | ICD-10-CM | POA: Diagnosis not present

## 2016-04-03 DIAGNOSIS — A599 Trichomoniasis, unspecified: Secondary | ICD-10-CM | POA: Insufficient documentation

## 2016-04-03 DIAGNOSIS — O99019 Anemia complicating pregnancy, unspecified trimester: Secondary | ICD-10-CM

## 2016-04-03 DIAGNOSIS — O23599 Infection of other part of genital tract in pregnancy, unspecified trimester: Secondary | ICD-10-CM

## 2016-04-03 DIAGNOSIS — O2340 Unspecified infection of urinary tract in pregnancy, unspecified trimester: Secondary | ICD-10-CM

## 2016-04-03 HISTORY — DX: Trichomonal vulvovaginitis: A59.01

## 2016-04-03 LAB — WET PREP, GENITAL
Clue Cells Wet Prep HPF POC: NONE SEEN
SPERM: NONE SEEN
Yeast Wet Prep HPF POC: NONE SEEN

## 2016-04-03 LAB — POCT FERN TEST: POCT FERN TEST: NEGATIVE

## 2016-04-03 MED ORDER — METRONIDAZOLE 500 MG PO TABS
2000.0000 mg | ORAL_TABLET | Freq: Once | ORAL | Status: AC
  Administered 2016-04-03: 2000 mg via ORAL
  Filled 2016-04-03: qty 4

## 2016-04-03 NOTE — MAU Provider Note (Signed)
History   [id number]   Chief Complaint  Patient presents with  . Rupture of Membranes    HPI Haley Scott is a 21 y.o. female  G2P1001 here with report of LOF.  She felt wet when she woke up today. She denies gush of fluid. Leaking of fluid has not continued.  Pt reports contractions mild, irregular. She denies vaginal bleeding.  She reports good fetal movement. All other systems negative.    Patient's last menstrual period was 06/29/2015 (exact date).  OB History  Gravida Para Term Preterm AB Living  2 1 1  0 0 1  SAB TAB Ectopic Multiple Live Births  0 0 0 0 1    # Outcome Date GA Lbr Len/2nd Weight Sex Delivery Anes PTL Lv  2 Current           1 Term 10/29/13     Vag-Spont        Complications: Postpartum hemorrhage      Past Medical History:  Diagnosis Date  . Anemia   . Umbilical hernia 2001    History reviewed. No pertinent family history.  Social History   Social History  . Marital status: Single    Spouse name: N/A  . Number of children: N/A  . Years of education: N/A   Social History Main Topics  . Smoking status: Never Smoker  . Smokeless tobacco: Never Used  . Alcohol use No  . Drug use: No  . Sexual activity: Yes    Birth control/ protection: None   Other Topics Concern  . None   Social History Narrative  . None    Allergies  Allergen Reactions  . Ibuprofen Hives    No current facility-administered medications on file prior to encounter.    Current Outpatient Prescriptions on File Prior to Encounter  Medication Sig Dispense Refill  . docusate sodium (COLACE) 100 MG capsule Take 1 capsule (100 mg total) by mouth 2 (two) times daily as needed. (Patient not taking: Reported on 02/21/2016) 30 capsule 3  . ferrous sulfate 325 (65 FE) MG tablet Take 1 tablet (325 mg total) by mouth 3 (three) times daily with meals. 90 tablet 1  . Prenat-FeFum-FePo-FA-Omega 3 (CONCEPT DHA) 53.5-38-1 MG CAPS Take 1 tablet by mouth daily. 90 capsule 3  .  valACYclovir (VALTREX) 500 MG tablet Take 1 tablet (500 mg total) by mouth daily. Can increase to 2 tablets (1000mg ) twice daily for 7 days during recurrence. 60 tablet 6     Review of Systems  Constitutional: Negative.   Genitourinary: Positive for vaginal discharge. Negative for vaginal bleeding.     Physical Exam   Vitals:   04/03/16 1414  BP: 127/65  Pulse: 113  Resp: 18  Temp: 98.5 F (36.9 C)  TempSrc: Oral  Weight: 59 kg (130 lb)  Height: 5' (1.524 m)    Physical Exam  Constitutional: She is oriented to person, place, and time. She appears well-developed and well-nourished. No distress.  HENT:  Head: Normocephalic and atraumatic.  Neck: Normal range of motion.  Respiratory: Effort normal.  GI: Soft. She exhibits no distension. There is no tenderness.  gravid  Genitourinary:  Genitourinary Comments: External: no lesions or erythema Vagina: rugated, parous, thick yellow discharge, no pool, fern neg SVE: 0.5/thick per RN  Musculoskeletal: Normal range of motion.  Neurological: She is alert and oriented to person, place, and time.  Skin: Skin is warm and dry.  Psychiatric: She has a normal mood and affect.  EFM: 135 bpm, mod variability, + accels, no decels Toco: irregular  MAU Course  Procedures  MDM Labs ordered and reviewed. No evidence of SROM. +trich. Notified pt and partner. Partner Rx provided. Stable for discharge home.   Assessment and Plan  [redacted] weeks gestation Reactive NST Trichomonas infection  Discharge home Follow up if office tomorrow as scheduled Abstain from IC x3 weeks Labor precautions  Allergies as of 04/03/2016      Reactions   Ibuprofen Hives      Medication List    STOP taking these medications   docusate sodium 100 MG capsule Commonly known as:  COLACE     TAKE these medications   CONCEPT DHA 53.5-38-1 MG Caps Take 1 tablet by mouth daily.   ferrous sulfate 325 (65 FE) MG tablet Take 1 tablet (325 mg total) by mouth  3 (three) times daily with meals.   valACYclovir 500 MG tablet Commonly known as:  VALTREX Take 1 tablet (500 mg total) by mouth daily. Can increase to 2 tablets (1000mg ) twice daily for 7 days during recurrence.      Donette Larry, CNM 04/03/2016 2:42 PM

## 2016-04-03 NOTE — MAU Note (Signed)
Pt reports she woke up this morning with a wet spot on her bed and has some leaking when she goes to the bathroom. Clear fluid good fetal movement. Having some mild ctx.

## 2016-04-03 NOTE — Discharge Instructions (Signed)
Trichomoniasis °Trichomoniasis is an infection caused by an organism called Trichomonas. The infection can affect both women and men. In women, the outer female genitalia and the vagina are affected. In men, the penis is mainly affected, but the prostate and other reproductive organs can also be involved. Trichomoniasis is a sexually transmitted infection (STI) and is most often passed to another person through sexual contact.  °RISK FACTORS °Having unprotected sexual intercourse. °Having sexual intercourse with an infected partner. °SIGNS AND SYMPTOMS  °Symptoms of trichomoniasis in women include: °Abnormal gray-green frothy vaginal discharge. °Itching and irritation of the vagina. °Itching and irritation of the area outside the vagina. °Symptoms of trichomoniasis in men include:  °Penile discharge with or without pain. °Pain during urination. This results from inflammation of the urethra. °DIAGNOSIS  °Trichomoniasis may be found during a Pap test or physical exam. Your health care provider may use one of the following methods to help diagnose this infection: °Testing the pH of the vagina with a test tape. °Using a vaginal swab test that checks for the Trichomonas organism. A test is available that provides results within a few minutes. °Examining a urine sample. °Testing vaginal secretions. °Your health care provider may test you for other STIs, including HIV. °TREATMENT  °You may be given medicine to fight the infection. Women should inform their health care provider if they could be or are pregnant. Some medicines used to treat the infection should not be taken during pregnancy. °Your health care provider may recommend over-the-counter medicines or creams to decrease itching or irritation. °Your sexual partner will need to be treated if infected. °Your health care provider may test you for infection again 3 months after treatment. °HOME CARE INSTRUCTIONS  °Take medicines only as directed by your health care  provider. °Take over-the-counter medicine for itching or irritation as directed by your health care provider. °Do not have sexual intercourse while you have the infection. °Women should not douche or wear tampons while they have the infection. °Discuss your infection with your partner. Your partner may have gotten the infection from you, or you may have gotten it from your partner. °Have your sex partner get examined and treated if necessary. °Practice safe, informed, and protected sex. °See your health care provider for other STI testing. °SEEK MEDICAL CARE IF:  °You still have symptoms after you finish your medicine. °You develop abdominal pain. °You have pain when you urinate. °You have bleeding after sexual intercourse. °You develop a rash. °Your medicine makes you sick or makes you throw up (vomit). °MAKE SURE YOU: °Understand these instructions. °Will watch your condition. °Will get help right away if you are not doing well or get worse. °This information is not intended to replace advice given to you by your health care provider. Make sure you discuss any questions you have with your health care provider. °Document Released: 08/13/2000 Document Revised: 03/10/2014 Document Reviewed: 11/29/2012 °Elsevier Interactive Patient Education © 2017 [employer] °  °

## 2016-04-04 ENCOUNTER — Ambulatory Visit (INDEPENDENT_AMBULATORY_CARE_PROVIDER_SITE_OTHER): Payer: BLUE CROSS/BLUE SHIELD | Admitting: Obstetrics and Gynecology

## 2016-04-04 ENCOUNTER — Encounter (HOSPITAL_COMMUNITY): Payer: Self-pay | Admitting: *Deleted

## 2016-04-04 ENCOUNTER — Telehealth (HOSPITAL_COMMUNITY): Payer: Self-pay | Admitting: *Deleted

## 2016-04-04 VITALS — BP 104/67 | HR 85 | Wt 129.0 lb

## 2016-04-04 DIAGNOSIS — Z3403 Encounter for supervision of normal first pregnancy, third trimester: Secondary | ICD-10-CM

## 2016-04-04 DIAGNOSIS — A6009 Herpesviral infection of other urogenital tract: Secondary | ICD-10-CM

## 2016-04-04 DIAGNOSIS — O98313 Other infections with a predominantly sexual mode of transmission complicating pregnancy, third trimester: Secondary | ICD-10-CM

## 2016-04-04 DIAGNOSIS — B009 Herpesviral infection, unspecified: Secondary | ICD-10-CM

## 2016-04-04 NOTE — Progress Notes (Signed)
   PRENATAL VISIT NOTE  Subjective:  Haley Scott is a 21 y.o. G2P1001 at 7051w0d being seen today for ongoing prenatal care.  She is currently monitored for the following issues for this low-risk pregnancy and has History of postpartum hemorrhage; Supervision of normal pregnancy; Anemia affecting pregnancy, antepartum; HSV-2 (herpes simplex virus 2) infection; UTI in pregnancy, antepartum; Echogenic focus of heart of fetus affecting antepartum care of mother; and Trichomonal vaginitis during pregnancy on her problem list.  Patient reports no complaints.  Contractions: Not present. Vag. Bleeding: None.  Movement: Present. Denies leaking of fluid.   The following portions of the patient's history were reviewed and updated as appropriate: allergies, current medications, past family history, past medical history, past social history, past surgical history and problem list. Problem list updated.  Objective:   Vitals:   04/04/16 0832  BP: 104/67  Pulse: 85  Weight: 129 lb (58.5 kg)    Fetal Status: Fetal Heart Rate (bpm): 140 Fundal Height: 39 cm Movement: Present     General:  Alert, oriented and cooperative. Patient is in no acute distress.  Skin: Skin is warm and dry. No rash noted.   Cardiovascular: Normal heart rate noted  Respiratory: Normal respiratory effort, no problems with respiration noted  Abdomen: Soft, gravid, appropriate for gestational age. Pain/Pressure: Present     Pelvic:  Cervical exam deferred        Extremities: Normal range of motion.  Edema: None  Mental Status: Normal mood and affect. Normal behavior. Normal judgment and thought content.   Assessment and Plan:  Pregnancy: G2P1001 at 6051w0d  1. Encounter for supervision of normal first pregnancy in third trimester Patient is doing well Postdate fetal testing next week Patient had a reactive NST yesterday Will set up for IOL at 41 weeks on 2/9  2. HSV-2 (herpes simplex virus 2) infection Continue  Valtrex  Term labor symptoms and general obstetric precautions including but not limited to vaginal bleeding, contractions, leaking of fluid and fetal movement were reviewed in detail with the patient. Please refer to After Visit Summary for other counseling recommendations.  Return in about 4 days (around 04/08/2016) for ROB/NST/AFI.   Catalina AntiguaPeggy Ephraim Reichel, MD

## 2016-04-04 NOTE — Telephone Encounter (Signed)
Preadmission screen  

## 2016-04-07 ENCOUNTER — Encounter (HOSPITAL_COMMUNITY): Payer: Self-pay

## 2016-04-07 ENCOUNTER — Inpatient Hospital Stay (HOSPITAL_COMMUNITY)
Admission: AD | Admit: 2016-04-07 | Discharge: 2016-04-10 | DRG: 774 | Disposition: A | Discharge status: Home / Self Care | Payer: BLUE CROSS/BLUE SHIELD | Source: Ambulatory Visit | Attending: Family Medicine | Admitting: Family Medicine

## 2016-04-07 DIAGNOSIS — O9832 Other infections with a predominantly sexual mode of transmission complicating childbirth: Secondary | ICD-10-CM | POA: Diagnosis present

## 2016-04-07 DIAGNOSIS — O4693 Antepartum hemorrhage, unspecified, third trimester: Secondary | ICD-10-CM | POA: Diagnosis present

## 2016-04-07 DIAGNOSIS — A6000 Herpesviral infection of urogenital system, unspecified: Secondary | ICD-10-CM | POA: Diagnosis present

## 2016-04-07 DIAGNOSIS — Z3A40 40 weeks gestation of pregnancy: Secondary | ICD-10-CM | POA: Diagnosis not present

## 2016-04-07 DIAGNOSIS — A5901 Trichomonal vulvovaginitis: Secondary | ICD-10-CM | POA: Diagnosis present

## 2016-04-07 LAB — CBC
HEMATOCRIT: 37.4 % (ref 36.0–46.0)
Hemoglobin: 12.6 g/dL (ref 12.0–15.0)
MCH: 28.8 pg (ref 26.0–34.0)
MCHC: 33.7 g/dL (ref 30.0–36.0)
MCV: 85.4 fL (ref 78.0–100.0)
PLATELETS: 254 10*3/uL (ref 150–400)
RBC: 4.38 MIL/uL (ref 3.87–5.11)
RDW: 14.6 % (ref 11.5–15.5)
WBC: 7.4 10*3/uL (ref 4.0–10.5)

## 2016-04-07 LAB — TYPE AND SCREEN
ABO/RH(D): O POS
ANTIBODY SCREEN: NEGATIVE

## 2016-04-07 MED ORDER — TERBUTALINE SULFATE 1 MG/ML IJ SOLN
0.2500 mg | Freq: Once | INTRAMUSCULAR | Status: DC | PRN
  Filled 2016-04-07: qty 1

## 2016-04-07 MED ORDER — OXYCODONE-ACETAMINOPHEN 5-325 MG PO TABS: 2.0000 | ORAL_TABLET | ORAL | Status: DC | PRN

## 2016-04-07 MED ORDER — ACETAMINOPHEN 325 MG PO TABS
650.0000 mg | ORAL_TABLET | ORAL | Status: DC | PRN
  Administered 2016-04-08: 650 mg via ORAL
  Filled 2016-04-07: qty 2

## 2016-04-07 MED ORDER — LIDOCAINE HCL (PF) 1 % IJ SOLN
30.0000 mL | INTRAMUSCULAR | Status: DC | PRN
  Filled 2016-04-07: qty 30

## 2016-04-07 MED ORDER — OXYTOCIN 40 UNITS IN LACTATED RINGERS INFUSION - SIMPLE MED
1.0000 m[IU]/min | INTRAVENOUS | Status: DC
  Administered 2016-04-07: 1 m[IU]/min via INTRAVENOUS
  Filled 2016-04-07: qty 1000

## 2016-04-07 MED ORDER — OXYCODONE-ACETAMINOPHEN 5-325 MG PO TABS: 1.0000 | ORAL_TABLET | ORAL | Status: DC | PRN

## 2016-04-07 MED ORDER — OXYTOCIN BOLUS FROM INFUSION: 500.0000 mL | Freq: Once | INTRAVENOUS | Status: DC

## 2016-04-07 MED ORDER — ONDANSETRON HCL 4 MG/2ML IJ SOLN: 4.0000 mg | Freq: Four times a day (QID) | INTRAMUSCULAR | Status: DC | PRN

## 2016-04-07 MED ORDER — OXYTOCIN 40 UNITS IN LACTATED RINGERS INFUSION - SIMPLE MED: 2.5000 [IU]/h | INTRAVENOUS | Status: DC

## 2016-04-07 MED ORDER — LACTATED RINGERS IV SOLN: 500.0000 mL | INTRAVENOUS | Status: DC | PRN

## 2016-04-07 MED ORDER — LACTATED RINGERS IV SOLN
INTRAVENOUS | Status: DC
  Administered 2016-04-08: 13:00:00 via INTRAVENOUS

## 2016-04-07 MED ORDER — SOD CITRATE-CITRIC ACID 500-334 MG/5ML PO SOLN: 30.0000 mL | ORAL | Status: DC | PRN

## 2016-04-07 NOTE — MAU Provider Note (Signed)
  History     CSN: [id number]  Arrival date and time: 04/07/16 1714   First Provider Initiated Contact with Patient 04/07/16 1755      No chief complaint on file.  HPI   Ms.Haley Scott is a 21 y.o. female G2P1001 @ 5246w3d here in MAU with acute onset of bright red vaginal bleeding. She was picking up with house when she felt her underwear to be wet. When she went to the bathroom she noticed bright red blood in her underwear. She denies pain at this time and reports + fetal movement.   History of HSV. Last Valtrex dose was:  OB History    Gravida Para Term Preterm AB Living   2 1 1  0 0 1   SAB TAB Ectopic Multiple Live Births   0 0 0 0 1      Past Medical History:  Diagnosis Date  . Anemia   . Umbilical hernia 2001    Past Surgical History:  Procedure Laterality Date  . HERNIA REPAIR    . VAGINAL DELIVERY  2015    No family history on file.  Social History  Substance Use Topics  . Smoking status: Never Smoker  . Smokeless tobacco: Never Used  . Alcohol use No    Allergies:  Allergies  Allergen Reactions  . Ibuprofen Hives    Prescriptions Prior to Admission  Medication Sig Dispense Refill Last Dose  . ferrous sulfate 325 (65 FE) MG tablet Take 1 tablet (325 mg total) by mouth 3 (three) times daily with meals. 90 tablet 1 04/06/2016 at Unknown time  . Prenat-FeFum-FePo-FA-Omega 3 (CONCEPT DHA) 53.5-38-1 MG CAPS Take 1 tablet by mouth daily. 90 capsule 3 04/06/2016 at Unknown time  . valACYclovir (VALTREX) 500 MG tablet Take 1 tablet (500 mg total) by mouth daily. Can increase to 2 tablets (1000mg ) twice daily for 7 days during recurrence. 60 tablet 6 04/06/2016 at Unknown time   No results found for this or any previous visit (from the past 48 hour(s)).  Review of Systems  Gastrointestinal: Negative for abdominal pain.  Genitourinary: Positive for vaginal bleeding.   Physical Exam   Blood pressure 108/66, pulse 87, temperature 98.7 F (37.1 C), temperature  source Oral, resp. rate 16, height 5' (1.524 m), weight 127 lb (57.6 kg), last menstrual period 06/29/2015.  Physical Exam  Constitutional: She is oriented to person, place, and time. She appears well-developed and well-nourished. No distress.  HENT:  Head: Normocephalic.  Eyes: Pupils are equal, round, and reactive to light.  GI: Soft. She exhibits no distension. There is no tenderness.  Genitourinary:  Genitourinary Comments: Vagina - Small amount of dark red blood in the vagina.   Cervix - + active bleeding oozing from the os  Bimanual exam: Dilation: 1 Effacement (%): 50 Station: -3 Exam by:: Haley Ron NP  Musculoskeletal: Normal range of motion.  Neurological: She is alert and oriented to person, place, and time.  Skin: Skin is warm. She is not diaphoretic.  Psychiatric: Her behavior is normal.   Fetal Tracing: Baseline: 130 bpm Variability: Moderate  Accelerations: 15x15 Decelerations: None Toco: None  MAU Course  Procedures  None  MDM  Discussed patient with Sharen CounterLisa Leftwich-Kirby CNM: admitting to birthing suits.   Assessment and Plan    A:  1. Vaginal bleeding in pregnancy, third trimester     P:  Admit to birthing suits GBS negative.    Duane LopeJennifer I Taelon Bendorf, NP 04/07/2016 6:11 PM

## 2016-04-07 NOTE — H&P (Signed)
LABOR AND DELIVERY ADMISSION HISTORY AND PHYSICAL NOTE  Haley CoddingtonKieshawn Scott is a 21 y.o. female G2P1001 with IUP at 2653w3d by LMP presenting for vaginal bleeding.  She reports having bright red blood trickling down her leg and pooling in her underwear earlier today.  Her bleeding has since resolved. She denies any CP, SOB, weakness or lightheadedness.  She denies leakage of fluid,contractions, headache, changes in vision, RUQ pain or edema.  She reports positive fetal movement.    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  Past Medical History: Past Medical History:  Diagnosis Date  . Anemia   . Umbilical hernia 2001    Past Surgical History: Past Surgical History:  Procedure Laterality Date  . HERNIA REPAIR    . VAGINAL DELIVERY  2015    Obstetrical History: OB History    Gravida Para Term Preterm AB Living   2 1 1  0 0 1   SAB TAB Ectopic Multiple Live Births   0 0 0 0 1      Social History: Social History   Social History  . Marital status: Single    Spouse name: N/A  . Number of children: N/A  . Years of education: N/A   Social History Main Topics  . Smoking status: Never Smoker  . Smokeless tobacco: Never Used  . Alcohol use No  . Drug use: No  . Sexual activity: Yes    Birth control/ protection: None   Other Topics Concern  . None   Social History Narrative  . None    Family History: History reviewed. No pertinent family history.  Allergies: Allergies  Allergen Reactions  . Ibuprofen Hives    Prescriptions Prior to Admission  Medication Sig Dispense Refill Last Dose  . ferrous sulfate 325 (65 FE) MG tablet Take 1 tablet (325 mg total) by mouth 3 (three) times daily with meals. 90 tablet 1 04/06/2016 at Unknown time  . Prenat-FeFum-FePo-FA-Omega 3 (CONCEPT DHA) 53.5-38-1 MG CAPS Take 1 tablet by mouth daily. 90 capsule 3 04/06/2016 at Unknown time  . valACYclovir (VALTREX) 500 MG tablet Take 1 tablet (500 mg total) by mouth  daily. Can increase to 2 tablets (1000mg ) twice daily for 7 days during recurrence. 60 tablet 6 04/06/2016 at Unknown time     Review of Systems   All systems reviewed and negative except as stated in HPI  Blood pressure 108/66, pulse 87, temperature 98.7 F (37.1 C), temperature source Oral, resp. rate 16, height 5' (1.524 m), weight 57.6 kg (127 lb), last menstrual period 06/29/2015. General appearance: alert, cooperative, appears stated age and no distress Lungs: clear to auscultation bilaterally Heart: regular rate and rhythm Abdomen: soft, non-tender; bowel sounds normal Extremities: No calf swelling or tenderness Presentation: cephalic, vertex confirmed on bedside US today Fetal monitoring: baseline 160s BPM, moderate variability, +accels, no decels Uterine activity: contractions q1-2 mins Dilation: 1 Effacement (%): 50 Station: -3 Exam by:: Rasch NP   Prenatal labs: ABO, Rh: --/--/O POS (02/05 1825) Antibody: PENDING (02/05 1825) Rubella: Immune  RPR: NON REAC (11/17 1033)  HBsAg: NEGATIVE (08/10 1424)  HIV: NONREACTIVE (11/17 1033)  GBS: Negative (01/25 0000)  1 hr Glucola: 76 (01/18/16) Genetic screening:  Quad negative (11/21/15) Anatomy US: Nrl female with isolated echogenic cardiac focus  Prenatal Transfer Tool  Maternal Diabetes: No Genetic Screening: Normal Maternal Ultrasounds/Referrals: Abnormal:  Findings:   Isolated EIF (echogenic intracardiac focus) Fetal Ultrasounds or other Referrals:  None Maternal Substance Abuse:  No Significant Maternal Medications:  Meds include: Other: acyclovir Significant Maternal Lab Results: None  Results for orders placed or performed during the hospital encounter of 04/07/16 (from the past 24 hour(s))  CBC   Collection Time: 04/07/16  6:25 PM  Result Value Ref Range   WBC 7.4 4.0 - 10.5 K/uL   RBC 4.38 3.87 - 5.11 MIL/uL   Hemoglobin 12.6 12.0 - 15.0 g/dL   HCT 40.9 81.1 - 91.4 %   MCV 85.4 78.0 - 100.0 fL   MCH  28.8 26.0 - 34.0 pg   MCHC 33.7 30.0 - 36.0 g/dL   RDW 78.2 95.6 - 21.3 %   Platelets 254 150 - 400 K/uL  Type and screen [facility]   Collection Time: 04/07/16  6:25 PM  Result Value Ref Range   ABO/RH(D) O POS    Antibody Screen PENDING    Sample Expiration 04/10/2016     Patient Active Problem List   Diagnosis Date Noted  . Vaginal bleeding in pregnancy, third trimester 04/07/2016  . Trichomonal vaginitis during pregnancy 04/03/2016  . Echogenic focus of heart of fetus affecting antepartum care of mother 11/17/2015  . UTI in pregnancy, antepartum 11/12/2015  . 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 Supervision of normal pregnancy 10/11/2015  . Anemia affecting pregnancy, antepartum 10/11/2015  . History of postpartum hemorrhage 11/01/2013    Assessment: Haley Scott is a 21 y.o. G2P1001 at [redacted]w[redacted]d here for IOL 2/2 vaginal bleeding this morning. HSV positive currently taking acyclovir BID.   #labor: IOL 2/2 vaginal bleeding, FB placed, anticipate SVD #Pain: IV fentanyl, declines epidural #FWB: Cat II (fetal tachycardia with baseline 160s bpm) #ID:  HSV2 positive, continue acyclovir BID #MOF: Breast #MOC: LARC #Circ:  N/A  Daniel L. Myrtie Soman, MD PGY-1 04/07/2016 8:02 PM   04/07/2016, 7:27 PM  Midwife attestation: I have seen and examined this patient; I agree with above documentation in the resident's note.   Haley Scott is a 21 y.o. [id number] [redacted]w[redacted]d here for vaginal bleeding at term.  PE: BP 103/66 (BP Location: Left Arm)   Pulse 70   Temp 98 F (36.7 C) (Oral)   Resp 16   Ht 5' (1.524 m)   Wt 127 lb (57.6 kg)   LMP 06/29/2015 (Exact Date)   SpO2 98%   Breastfeeding? Unknown   BMI 24.80 kg/m  Gen: calm comfortable, NAD Resp: normal effort, no distress Abd: gravid  ROS, labs, PMH reviewed  Plan: Admit to LD Labor: IOL with foley bulb placement, consider starting Pitocin FWB: Category I ID: GBS negative  Sharen Counter, CNM  04/09/2016, 11:26 AM

## 2016-04-07 NOTE — MAU Note (Addendum)
Was at home cleaning, felt something wet.  Went to the restroom, it was blood. Denies recent exam or intercourse. "little bit of contractions.'  Denies any placental issues.  abd soft on palp.

## 2016-04-07 NOTE — Progress Notes (Signed)
Patient seen. Doing well. Foley in place. Will start pitocin at 1x1 with max of 6mu while foley in place. No more bleeding continue to expect SVD. Category 1 FHT.

## 2016-04-07 NOTE — Anesthesia Pain Management Evaluation Note (Signed)
  CRNA Pain Management Visit Note  Patient: Haley CoddingtonKieshawn Scott, 21 y.o., female  "Hello I am a member of the anesthesia team at [facility]. We have an anesthesia team available at all times to provide care throughout the hospital, including epidural management and anesthesia for C-section. I don't know your plan for the delivery whether it a natural birth, water birth, IV sedation, nitrous supplementation, doula or epidural, but we want to meet your pain goals."   1.Was your pain managed to your expectations on prior hospitalizations?   Yes   2.What is your expectation for pain management during this hospitalization?     IV pain meds  3.How can we help you reach that goal? IV pain meds.  Record the patient's initial score and the patient's pain goal.   Pain: 0  Pain Goal: 7 [facility] wants you to be able to say your pain was always managed very well.  Haley Scott 04/07/2016

## 2016-04-08 ENCOUNTER — Encounter (HOSPITAL_COMMUNITY): Payer: Self-pay | Admitting: *Deleted

## 2016-04-08 ENCOUNTER — Encounter: Payer: BLUE CROSS/BLUE SHIELD | Admitting: Obstetrics & Gynecology

## 2016-04-08 DIAGNOSIS — Z3A40 40 weeks gestation of pregnancy: Secondary | ICD-10-CM

## 2016-04-08 LAB — RPR: RPR: NONREACTIVE

## 2016-04-08 MED ORDER — PRENATAL MULTIVITAMIN CH
1.0000 | ORAL_TABLET | Freq: Every day | ORAL | Status: DC
  Administered 2016-04-09 – 2016-04-10 (×2): 1 via ORAL
  Filled 2016-04-08 (×2): qty 1

## 2016-04-08 MED ORDER — ZOLPIDEM TARTRATE 5 MG PO TABS: 5.0000 mg | ORAL_TABLET | Freq: Every evening | ORAL | Status: DC | PRN

## 2016-04-08 MED ORDER — TETANUS-DIPHTH-ACELL PERTUSSIS 5-2.5-18.5 LF-MCG/0.5 IM SUSP: 0.5000 mL | Freq: Once | INTRAMUSCULAR | Status: DC

## 2016-04-08 MED ORDER — BENZOCAINE-MENTHOL 20-0.5 % EX AERO: 1.0000 "application " | INHALATION_SPRAY | CUTANEOUS | Status: DC | PRN

## 2016-04-08 MED ORDER — WITCH HAZEL-GLYCERIN EX PADS: 1.0000 "application " | MEDICATED_PAD | CUTANEOUS | Status: DC | PRN

## 2016-04-08 MED ORDER — COCONUT OIL OIL: 1.0000 "application " | TOPICAL_OIL | Status: DC | PRN

## 2016-04-08 MED ORDER — SIMETHICONE 80 MG PO CHEW: 80.0000 mg | CHEWABLE_TABLET | ORAL | Status: DC | PRN

## 2016-04-08 MED ORDER — MEASLES, MUMPS & RUBELLA VAC ~~LOC~~ INJ
0.5000 mL | INJECTION | Freq: Once | SUBCUTANEOUS | Status: DC
  Filled 2016-04-08: qty 0.5

## 2016-04-08 MED ORDER — FENTANYL CITRATE (PF) 100 MCG/2ML IJ SOLN
100.0000 ug | INTRAMUSCULAR | Status: DC | PRN
  Administered 2016-04-08 (×3): 100 ug via INTRAVENOUS
  Filled 2016-04-08 (×3): qty 2

## 2016-04-08 MED ORDER — ONDANSETRON HCL 4 MG PO TABS: 4.0000 mg | ORAL_TABLET | ORAL | Status: DC | PRN

## 2016-04-08 MED ORDER — METHYLERGONOVINE MALEATE 0.2 MG/ML IJ SOLN
INTRAMUSCULAR | Status: AC
  Administered 2016-04-08: 0.2 mg
  Filled 2016-04-08: qty 1

## 2016-04-08 MED ORDER — DIPHENHYDRAMINE HCL 25 MG PO CAPS: 25.0000 mg | ORAL_CAPSULE | Freq: Four times a day (QID) | ORAL | Status: DC | PRN

## 2016-04-08 MED ORDER — DIBUCAINE 1 % RE OINT
1.0000 | TOPICAL_OINTMENT | RECTAL | Status: DC | PRN
Start: 2016-04-08 — End: 2016-04-10

## 2016-04-08 MED ORDER — ACETAMINOPHEN 325 MG PO TABS
650.0000 mg | ORAL_TABLET | ORAL | Status: DC | PRN
  Administered 2016-04-09 – 2016-04-10 (×7): 650 mg via ORAL
  Filled 2016-04-08 (×8): qty 2

## 2016-04-08 MED ORDER — ONDANSETRON HCL 4 MG/2ML IJ SOLN: 4.0000 mg | INTRAMUSCULAR | Status: DC | PRN

## 2016-04-08 MED ORDER — SENNOSIDES-DOCUSATE SODIUM 8.6-50 MG PO TABS
2.0000 | ORAL_TABLET | ORAL | Status: DC
  Administered 2016-04-09 (×2): 2 via ORAL
  Filled 2016-04-08 (×2): qty 2

## 2016-04-09 LAB — CBC
HEMATOCRIT: 28.8 % — AB (ref 36.0–46.0)
Hemoglobin: 9.9 g/dL — ABNORMAL LOW (ref 12.0–15.0)
MCH: 29.1 pg (ref 26.0–34.0)
MCHC: 34.4 g/dL (ref 30.0–36.0)
MCV: 84.7 fL (ref 78.0–100.0)
PLATELETS: 234 10*3/uL (ref 150–400)
RBC: 3.4 MIL/uL — AB (ref 3.87–5.11)
RDW: 14.6 % (ref 11.5–15.5)
WBC: 13.6 10*3/uL — AB (ref 4.0–10.5)

## 2016-04-09 NOTE — Progress Notes (Signed)
Post Partum Day #1 Subjective: no complaints, up ad lib, voiding and tolerating PO  Objective: Blood pressure 99/62, pulse 96, temperature 98.6 F (37 C), temperature source Oral, resp. rate 18, height 5' (1.524 m), weight 127 lb (57.6 kg), last menstrual period 06/29/2015, SpO2 98 %, unknown if currently breastfeeding.  Physical Exam:  General: alert, cooperative and no distress Lochia: appropriate Uterine Fundus: firm Incision: none DVT Evaluation: No evidence of DVT seen on physical exam. No cords or calf tenderness. No significant calf/ankle edema.   Recent Labs  04/07/16 1825  HGB 12.6  HCT 37.4    Assessment/Plan: Plan for discharge tomorrow, Breastfeeding and Contraception LARK   LOS: 2 days   Haley Scott, CNM 04/09/2016, 7:27 AM

## 2016-04-09 NOTE — Lactation Note (Addendum)
This note was copied from a baby's chart. Lactation Consultation Note Experienced BF mom has 2 1/22 yr old whom she BF for 10 months. exclusivley for 9 months until mom returned to work. Denied any difficulty. Mom stated baby last latch was good. Baby has been spit. When baby cried while swaddling, noted heart shaped upper gum w/tight upper lip labial frenulum. No anterior frenulum noted. Mom stated she needed to flange upper lip when latched. Mom encouraged to feed baby 8-12 times/24 hours and with feeding cues. Wake baby if hasn't cued to feed in 3 hours. Reviewed newborn feeding habits and cluster feeding. Encouraged STS, I&O. Mom voiced what she remembered about BF wither her 1st child. Praised mom for retaining so much knowledge. Mom does have concern that 1st child will want to start BF when sees mom BF baby.  WH/LC brochure given w/resources, support groups and LC services. Call if has if needs assistance or questions. Patient Name: Haley Joanie CoddingtonKieshawn Scott ZOXWR'UToday's Date: 04/09/2016 Reason for consult: Initial assessment   Maternal Data Has patient been taught Hand Expression?: Yes Does the patient have breastfeeding experience prior to this delivery?: Yes  Feeding Feeding Type: Breast Fed  LATCH Score/Interventions Latch: Repeated attempts needed to sustain latch, nipple held in mouth throughout feeding, stimulation needed to elicit sucking reflex. Intervention(s): Assist with latch;Breast compression  Audible Swallowing: A few with stimulation  Type of Nipple: Everted at rest and after stimulation  Comfort (Breast/Nipple): Soft / non-tender     Hold (Positioning): No assistance needed to correctly position infant at breast. Intervention(s): Breastfeeding basics reviewed;Support Pillows;Position options;Skin to skin  LATCH Score: 8  Lactation Tools Discussed/Used WIC Program: No (will apply)   Consult Status Consult Status: Follow-up Date: 04/10/16 Follow-up type:  In-patient    Ryna Beckstrom, Diamond NickelLAURA G 04/09/2016, 1:05 AM

## 2016-04-10 MED ORDER — DOCUSATE SODIUM 100 MG PO CAPS: 100.0000 mg | ORAL_CAPSULE | Freq: Two times a day (BID) | ORAL | 0 refills | Status: DC

## 2016-04-10 MED ORDER — FERROUS SULFATE 325 (65 FE) MG PO TABS: 325.0000 mg | ORAL_TABLET | Freq: Three times a day (TID) | ORAL | 1 refills | Status: DC

## 2016-04-10 NOTE — Discharge Summary (Signed)
OB Discharge Summary     Patient Name: Haley CoddingtonKieshawn Scott DOB: 03/09/1995 MRN: [id number]  Date of admission: 04/07/2016 Delivering MD: Shonna ChockWOUK, NOAH BEDFORD   Date of discharge: 04/10/2016  Admitting diagnosis: 40WKS BLEEDING Intrauterine pregnancy: 1531w4d     Secondary diagnosis:  Active Problems:   Vaginal bleeding in pregnancy, third trimester  Additional problems: Brisk bleeding PP s/p methergine x1. Trich infection     Discharge diagnosis: Term Pregnancy Delivered                                                                                                Post partum procedures:none  Augmentation: AROM, Pitocin and Foley Balloon  Complications: None  Hospital course:  Induction of Labor With Vaginal Delivery   21 y.o. yo [id number] at 5831w4d was admitted to the hospital 04/07/2016 for induction of labor.  Indication for induction: questionable abruption.  Patient had an uncomplicated labor course as follows: Membrane Rupture Time/Date: 1:30 PM ,04/08/2016   Intrapartum Procedures: Episiotomy: None [1]                                         Lacerations:  None [1]  Patient had delivery of a Viable infant.  Information for the patient's newborn:  Thomasene LotReid, Girl Cedric [[id number]][[id number]]  Delivery Method: Vaginal, Spontaneous Delivery (Filed from Delivery Summary)   04/08/2016  Details of delivery can be found in separate delivery note.  Patient had a routine postpartum course. Patient is discharged home 04/10/16.  Physical exam  Vitals:   04/09/16 0300 04/09/16 0730 04/09/16 1921 04/10/16 0601  BP:  103/66 (!) 107/54 104/64  Pulse:  70 92 67  Resp:  16 18 18   Temp: 98.6 F (37 C) 98 F (36.7 C) 98.2 F (36.8 C) 97.8 F (36.6 C)  TempSrc: Oral Oral Oral   SpO2:      Weight:      Height:       General: alert, cooperative and no distress Lochia: appropriate Uterine Fundus: firm Incision: N/A DVT Evaluation: No evidence of DVT seen on physical exam. Labs: Lab Results  Component  Value Date   WBC 13.6 (H) 04/09/2016   HGB 9.9 (L) 04/09/2016   HCT 28.8 (L) 04/09/2016   MCV 84.7 04/09/2016   PLT 234 04/09/2016   No flowsheet data found.  Discharge instruction: per After Visit Summary and "Baby and Me Booklet".  After visit meds:  Allergies as of 04/10/2016      Reactions   Ibuprofen Hives      Medication List    STOP taking these medications   CONCEPT DHA 53.5-38-1 MG Caps     TAKE these medications   docusate sodium 100 MG capsule Commonly known as:  COLACE Take 1 capsule (100 mg total) by mouth 2 (two) times daily.   ferrous sulfate 325 (65 FE) MG tablet Take 1 tablet (325 mg total) by mouth 3 (three) times daily with meals.   valACYclovir 500 MG tablet Commonly known as:  VALTREX Take 1 tablet (500 mg total) by mouth daily. Can increase to 2 tablets (1000mg ) twice daily for 7 days during recurrence.       Diet: routine diet  Activity: Advance as tolerated. Pelvic rest for 6 weeks.   Outpatient follow up:6 weeks Follow up Appt:No future appointments. Follow up Visit:No Follow-up on file.  Postpartum contraception: Progesterone only pills and IUD Mirena  Newborn Data: Live born female  Birth Weight: 6 lb 14.8 oz (3140 g) APGAR: 9, 9  Baby Feeding: Breast Disposition:home with mother   04/10/2016 Renne Musca, MD  Patient was seen and examined by me also Agree with note Vitals stable Labs stable Fundus firm, lochia within normal limits Perineum healing Ext WNL  Ready for discharge  Aviva Signs, CNM

## 2016-04-10 NOTE — Discharge Instructions (Signed)

## 2016-04-10 NOTE — Lactation Note (Signed)
This note was copied from a baby's chart. Lactation Consultation Note  Patient Name: Haley Joanie CoddingtonKieshawn Kempa ZOXWR'UToday's Date: 04/10/2016 Reason for consult: Follow-up assessment;Infant weight loss (6% weight loss )  Baby is 2542 hours old and has been exclusively breast fed since birth. Per mom the baby has been cluster feeding. @ consult watched mom start the latch - using the cradle and the baby nibbling onto the breast. LC discussed with mom importance of obtaining the depth the breast.  LC had mom massage , hand express, and provided adequate support, and showed mom how to get the baby to open wide and latch with breast compressions. Multiple swallows noted , increased with breast compressions. And baby fed 8 - 10 mins and released.  Mom re-latched in a modified laid back / to sitting up and baby obtained depth and was  Feeding in an active swallowing pattern.  Sore nipple and engorgement prevention tx reviewed. LC Instructed mom on the use shells , and hand pump. Per mom used the #24 Flange 1st baby and it was comfortable.  Mother informed of post-discharge support and given phone number to the lactation department, including services for phone call assistance; out-patient appointments; and breastfeeding support group. List of other breastfeeding resources in the community given in the handout. Encouraged mother to call for problems or concerns related to breastfeeding.      Maternal Data    Feeding Feeding Type: Breast Fed Length of feed: 10 min (multiple swallows noted )  LATCH Score/Interventions Latch: Grasps breast easily, tongue down, lips flanged, rhythmical sucking. Intervention(s): Adjust position;Assist with latch;Breast massage;Breast compression  Audible Swallowing: Spontaneous and intermittent Intervention(s): Skin to skin  Type of Nipple: Everted at rest and after stimulation  Comfort (Breast/Nipple): Filling, red/small blisters or bruises, mild/mod discomfort     Hold  (Positioning): Assistance needed to correctly position infant at breast and maintain latch. (worked on depth ) Intervention(s): Breastfeeding basics reviewed;Support Pillows;Position options  LATCH Score: 8  Lactation Tools Discussed/Used Pump Review: Setup, frequency, and cleaning;Milk Storage Initiated by:: MAI  Date initiated:: 04/10/16   Consult Status Consult Status: Follow-up Date: 04/10/16    Kathrin GreathouseMargaret Ann Aizlyn Schifano 04/10/2016, 11:09 AM

## 2016-04-12 ENCOUNTER — Inpatient Hospital Stay (HOSPITAL_COMMUNITY): Admission: RE | Admit: 2016-04-12 | Payer: BLUE CROSS/BLUE SHIELD | Source: Ambulatory Visit

## 2016-06-09 ENCOUNTER — Encounter: Payer: Self-pay | Admitting: Obstetrics and Gynecology

## 2016-06-09 ENCOUNTER — Other Ambulatory Visit (HOSPITAL_COMMUNITY)
Admission: RE | Admit: 2016-06-09 | Discharge: 2016-06-09 | Disposition: A | Discharge status: Home / Self Care | Payer: Medicaid Other | Source: Ambulatory Visit | Attending: Obstetrics and Gynecology | Admitting: Obstetrics and Gynecology

## 2016-06-09 ENCOUNTER — Ambulatory Visit (INDEPENDENT_AMBULATORY_CARE_PROVIDER_SITE_OTHER): Payer: Medicaid Other | Admitting: Obstetrics and Gynecology

## 2016-06-09 VITALS — BP 112/64 | HR 81 | Ht 60.0 in | Wt 108.0 lb

## 2016-06-09 DIAGNOSIS — R87612 Low grade squamous intraepithelial lesion on cytologic smear of cervix (LGSIL): Secondary | ICD-10-CM | POA: Diagnosis not present

## 2016-06-09 DIAGNOSIS — Z30430 Encounter for insertion of intrauterine contraceptive device: Secondary | ICD-10-CM | POA: Diagnosis not present

## 2016-06-09 DIAGNOSIS — A5901 Trichomonal vulvovaginitis: Secondary | ICD-10-CM

## 2016-06-09 DIAGNOSIS — O23593 Infection of other part of genital tract in pregnancy, third trimester: Principal | ICD-10-CM

## 2016-06-09 MED ORDER — LEVONORGESTREL 18.6 MCG/DAY IU IUD
INTRAUTERINE_SYSTEM | Freq: Once | INTRAUTERINE | Status: AC
  Administered 2016-06-09: 16:00:00 via INTRAUTERINE

## 2016-06-09 NOTE — Progress Notes (Signed)
Obstetrics Visit Postpartum Visit  Appointment Date: 06/09/2016  OBGYN Clinic: [facility]  Primary Care Provider: No PCP Per Patient  Chief Complaint:  Chief Complaint  Patient presents with  . Postpartum Care    History of Present Illness: Haley Scott is a 21 y.o. African-American [id number] (LMP: none), seen for the above chief complaint. Her past medical history is significant for h/o trich and hsv   She is s/p SVD/intact perineum on 2/6; she was discharged to home on PPD#2  Vaginal bleeding or discharge: No  Breast or formula feeding: breast only Intercourse: Yes, no issues Contraception after delivery: No  PP depression s/s: No  Any bowel or bladder issues: No  Pap smear: none  Review of Systems:  as noted in the History of Present Illness.  Medications Ms. Delancy had no medications administered during this visit. Current Outpatient Prescriptions  Medication Sig Dispense Refill  . docusate sodium (COLACE) 100 MG capsule Take 1 capsule (100 mg total) by mouth 2 (two) times daily. 30 capsule 0  . valACYclovir (VALTREX) 500 MG tablet Take 1 tablet (500 mg total) by mouth daily. Can increase to 2 tablets ( ) twice daily for 7 days during recurrence. 60 tablet 6   No current facility-administered medications for this visit.     Allergies Ibuprofen  Physical Exam:  BP 112/64   Pulse 81   Ht 5' (1.524 m)   Wt 108 lb (49 kg)   Breastfeeding? Yes   BMI 21.09 kg/m  Body mass index is 21.09 kg/m. General appearance: Well nourished, well developed female in no acute distress.  Abdomen: no masses, hernias; diffusely non tender to palpation, non distended Neuro/Psych:  Normal mood and affect.  Skin:  Warm and dry.  Lymphatic:  No inguinal lymphadenopathy.   Pelvic exam: is not limited by body habitus EGBUS: within normal limits Vagina: within normal limits and with no blood in the vault, Cervix:  no lesions or cervical motion tenderness Uterus:   nonenlarged and approximately 8 week sized Adnexa:  normal adnexa and no mass, fullness, tenderness Rectovaginal: deferred  See procedure note for Liletta insertion  Laboratory: UPT negative  PP Depression Screening:  0  Assessment: pt doing well  Plan: routine care. Pt told to consider IUD effective in one week and may not get period since breast feeding only.  RTC 29m  Cornelia Copa MD Attending [facility]wife)

## 2016-06-09 NOTE — Procedures (Signed)
Intrauterine Device (IUD) Insertion Procedure Note  After her pap smear and STD testing was done, written consent was obtained; her urine pregnancy test was: negative The patient understands the risks of IUD placement, which include but are not limited to: bleeding, infection, uterine perforation, risk of expulsion, risk of failure < 1%, increased risk of ectopic pregnancy in the event of failure.   Prior to the procedure being performed, the patient (or guardian) was asked to state their full name, date of birth, and the type of procedure being performed. A bimanual exam showed the uterus to be anteverted.  Next, the cervix and vagina were cleaned with an antiseptic solution, and the cervix was grasped with a tenaculum; the internal os had to be opened with the os finder.  The uterus was sounded to 9 cm.  The Liletta was placed without difficulty in the usual fashion.  The strings were cut to 3-4 cm.  The tenaculum was removed and cervix was found to be hemostatic.    No complications, patient tolerated the procedure well.  Cornelia Copa MD Attending [facility] Midwife)

## 2016-06-17 LAB — CYTOLOGY - PAP
Chlamydia: NEGATIVE
Neisseria Gonorrhea: NEGATIVE
TRICH (WINDOWPATH): NEGATIVE

## 2016-06-19 ENCOUNTER — Encounter: Payer: Self-pay | Admitting: Obstetrics and Gynecology

## 2016-06-19 DIAGNOSIS — R87612 Low grade squamous intraepithelial lesion on cytologic smear of cervix (LGSIL): Secondary | ICD-10-CM

## 2016-06-19 HISTORY — DX: Low grade squamous intraepithelial lesion on cytologic smear of cervix (LGSIL): R87.612

## 2016-07-07 ENCOUNTER — Ambulatory Visit: Payer: Medicaid Other | Admitting: Obstetrics & Gynecology

## 2016-07-08 ENCOUNTER — Encounter: Payer: Self-pay | Admitting: Obstetrics and Gynecology

## 2016-07-08 ENCOUNTER — Telehealth: Payer: Self-pay | Admitting: *Deleted

## 2016-07-08 ENCOUNTER — Ambulatory Visit: Payer: Medicaid Other | Admitting: Family Medicine

## 2016-07-08 NOTE — Telephone Encounter (Signed)
Called pt to review pap results, no answer, left message to call the office.

## 2016-07-08 NOTE — Telephone Encounter (Signed)
Informed pt of pap result and Dr Vergie LivingPickens recommendation to follow-up in one year, pt acknowledged instructions.

## 2016-07-08 NOTE — Progress Notes (Signed)
Can you let her know that her pap smear was slightly abnormal and to repeat it in one year? thanks

## 2016-07-10 ENCOUNTER — Telehealth: Payer: Self-pay | Admitting: *Deleted

## 2016-07-10 ENCOUNTER — Ambulatory Visit: Payer: Medicaid Other | Admitting: Family Medicine

## 2016-07-10 NOTE — Telephone Encounter (Signed)
Called pt to go over Pap Smear - pt expressed understanding

## 2016-07-15 ENCOUNTER — Ambulatory Visit: Payer: Medicaid Other | Admitting: Family Medicine

## 2017-06-17 ENCOUNTER — Encounter (HOSPITAL_COMMUNITY): Payer: Self-pay | Admitting: *Deleted

## 2017-06-17 ENCOUNTER — Other Ambulatory Visit: Payer: Self-pay

## 2017-06-17 ENCOUNTER — Emergency Department (HOSPITAL_COMMUNITY)
Admission: EM | Admit: 2017-06-17 | Discharge: 2017-06-17 | Disposition: A | Discharge status: Home / Self Care | Payer: Medicaid Other | Attending: Emergency Medicine | Admitting: Emergency Medicine

## 2017-06-17 ENCOUNTER — Emergency Department (HOSPITAL_COMMUNITY): Payer: Medicaid Other

## 2017-06-17 DIAGNOSIS — Y999 Unspecified external cause status: Secondary | ICD-10-CM | POA: Diagnosis not present

## 2017-06-17 DIAGNOSIS — S4992XA Unspecified injury of left shoulder and upper arm, initial encounter: Secondary | ICD-10-CM | POA: Diagnosis present

## 2017-06-17 DIAGNOSIS — X509XXA Other and unspecified overexertion or strenuous movements or postures, initial encounter: Secondary | ICD-10-CM | POA: Diagnosis not present

## 2017-06-17 DIAGNOSIS — Y9389 Activity, other specified: Secondary | ICD-10-CM | POA: Insufficient documentation

## 2017-06-17 DIAGNOSIS — S42322A Displaced transverse fracture of shaft of humerus, left arm, initial encounter for closed fracture: Secondary | ICD-10-CM | POA: Diagnosis not present

## 2017-06-17 DIAGNOSIS — Y929 Unspecified place or not applicable: Secondary | ICD-10-CM | POA: Insufficient documentation

## 2017-06-17 LAB — CBC WITH DIFFERENTIAL/PLATELET
BASOS ABS: 0 10*3/uL (ref 0.0–0.1)
BASOS PCT: 0 %
EOS ABS: 0 10*3/uL (ref 0.0–0.7)
EOS PCT: 0 %
HCT: 37.9 % (ref 36.0–46.0)
Hemoglobin: 12.1 g/dL (ref 12.0–15.0)
Lymphocytes Relative: 23 %
Lymphs Abs: 2.2 10*3/uL (ref 0.7–4.0)
MCH: 27.1 pg (ref 26.0–34.0)
MCHC: 31.9 g/dL (ref 30.0–36.0)
MCV: 85 fL (ref 78.0–100.0)
MONO ABS: 0.5 10*3/uL (ref 0.1–1.0)
Monocytes Relative: 5 %
Neutro Abs: 6.9 10*3/uL (ref 1.7–7.7)
Neutrophils Relative %: 72 %
PLATELETS: 355 10*3/uL (ref 150–400)
RBC: 4.46 MIL/uL (ref 3.87–5.11)
RDW: 15.1 % (ref 11.5–15.5)
WBC: 9.6 10*3/uL (ref 4.0–10.5)

## 2017-06-17 LAB — COMPREHENSIVE METABOLIC PANEL
ALT: 13 U/L — AB (ref 14–54)
AST: 19 U/L (ref 15–41)
Albumin: 4.3 g/dL (ref 3.5–5.0)
Alkaline Phosphatase: 71 U/L (ref 38–126)
Anion gap: 13 (ref 5–15)
BUN: 16 mg/dL (ref 6–20)
CHLORIDE: 106 mmol/L (ref 101–111)
CO2: 21 mmol/L — AB (ref 22–32)
Calcium: 9.6 mg/dL (ref 8.9–10.3)
Creatinine, Ser: 0.71 mg/dL (ref 0.44–1.00)
GFR calc Af Amer: >60 mL/min (ref >60)
Glucose, Bld: 124 mg/dL — ABNORMAL HIGH (ref 65–99)
POTASSIUM: 3.3 mmol/L — AB (ref 3.5–5.1)
SODIUM: 140 mmol/L (ref 135–145)
Total Bilirubin: 0.5 mg/dL (ref 0.3–1.2)
Total Protein: 7.9 g/dL (ref 6.5–8.1)

## 2017-06-17 MED ORDER — ONDANSETRON HCL 4 MG/2ML IJ SOLN
4.0000 mg | Freq: Once | INTRAMUSCULAR | Status: AC
  Administered 2017-06-17: 4 mg via INTRAVENOUS
  Filled 2017-06-17: qty 2

## 2017-06-17 MED ORDER — OXYCODONE-ACETAMINOPHEN 5-325 MG PO TABS: 1.0000 | ORAL_TABLET | ORAL | 0 refills | Status: DC | PRN

## 2017-06-17 MED ORDER — MORPHINE SULFATE (PF) 4 MG/ML IV SOLN
8.0000 mg | Freq: Once | INTRAVENOUS | Status: AC
  Administered 2017-06-17: 8 mg via INTRAVENOUS
  Filled 2017-06-17: qty 2

## 2017-06-17 MED ORDER — DOCUSATE SODIUM 100 MG PO CAPS: 100.0000 mg | ORAL_CAPSULE | Freq: Two times a day (BID) | ORAL | 0 refills | Status: DC

## 2017-06-17 NOTE — ED Notes (Signed)
2nd Dose of Morphine 4mg  has been given.

## 2017-06-17 NOTE — ED Notes (Signed)
Ortho Tech in room applying splint.

## 2017-06-17 NOTE — ED Provider Notes (Signed)
[facility]   CSN: [id number] Arrival date & time: 06/17/17  1012     History   Chief Complaint Chief Complaint  Patient presents with  . Arm Injury    left    HPI Haley Scott is a 22 y.o. female.  HPI 22 year old female presents the emergency department with severe acute onset left upper arm pain.  She was simply twirling a child's toy with her left hand when she threw it and then had a sudden pain in her left arm and reports some obvious deformity of her left mid humerus.  She is healthy and has no medical problems.  Her pain is severe in severity at this time.  She has no other complaints.  She presents with obvious deformity.   Past Medical History:  Diagnosis Date  . History of postpartum hemorrhage 11/01/2013  . Umbilical hernia 2001  . UTI in pregnancy, antepartum 11/12/2015   @19  wk- E coli UTI--> treated with Keflex [x]  TOC neg +mid nov [x]  toc late December-->january: neg    Patient Active Problem List   Diagnosis Date Noted  . Low grade squamous intraepith lesion on cytologic smear cervix (lgsil) 06/19/2016  . Trichomonal vaginitis during pregnancy 04/03/2016  . 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 Supervision of normal pregnancy 10/11/2015    Past Surgical History:  Procedure Laterality Date  . HERNIA REPAIR       OB History    Gravida  2   Para  2   Term  2   Preterm  0   AB  0   Living  2     SAB  0   TAB  0   Ectopic  0   Multiple  0   Live Births  2            Home Medications    Prior to Admission medications   Medication Sig Start Date End Date Taking? Authorizing Provider  Multiple Vitamins-Minerals (WOMENS DAILY FORMULA PO) Take 1 tablet by mouth daily.   Yes [provider]  docusate sodium (COLACE) 100 MG capsule Take 1 capsule (100 mg total) by mouth every 12 (twelve) hours. 06/17/17   Azalia Bilisampos, Chloe Bluett, MD  oxyCODONE-acetaminophen  (PERCOCET/ROXICET) 5-325 MG tablet Take 1 tablet by mouth every 4 (four) hours as needed for severe pain. 06/17/17   Azalia Bilisampos, Deveion Denz, MD  valACYclovir (VALTREX) 500 MG tablet Take 1 tablet (500 mg total) by mouth daily. Can increase to 2 tablets (1000mg ) twice daily for 7 days during recurrence. Patient not taking: Reported on 06/17/2017 11/07/15   Federico FlakeNewton, Kimberly Niles, MD    Family History No family history on file.  Social History Social History   Tobacco Use  . Smoking status: Never Smoker  . Smokeless tobacco: Never Used  Substance Use Topics  . Alcohol use: No  . Drug use: No     Allergies   Ibuprofen   Review of Systems Review of Systems  All other systems reviewed and are negative.    Physical Exam Updated Vital Signs BP 124/74   Pulse 60   Temp (!) 97.5 F (36.4 C) (Oral)   Resp 14   Ht 5' (1.524 m)   Wt 49.9 kg (110 lb)   SpO2 100%   BMI 21.48 kg/m   Physical Exam  Constitutional: She is oriented to person, place, and time. She appears well-developed and well-nourished.  HENT:  Head: Normocephalic.  Eyes: EOM are  normal.  Neck: Normal range of motion.  Pulmonary/Chest: Effort normal.  Abdominal: She exhibits no distension.  Musculoskeletal: Normal range of motion.  Normal grip strength left hand.  Full range of motion of left wrist and left elbow.  Normal left radial pulse.  Obvious mid humeral deformity on the left.  No open component.  Neurological: She is alert and oriented to person, place, and time.  Psychiatric: She has a normal mood and affect.  Nursing note and vitals reviewed.    ED Treatments / Results  Labs (all labs ordered are listed, but only abnormal results are displayed) Labs Reviewed  COMPREHENSIVE METABOLIC PANEL - Abnormal; Notable for the following components:      Result Value   Potassium 3.3 (*)    CO2 21 (*)    Glucose, Bld 124 (*)    ALT 13 (*)    All other components within normal limits  CBC WITH  DIFFERENTIAL/PLATELET    EKG None  Radiology Dg Humerus Left  Result Date: 06/17/2017 CLINICAL DATA:  Left arm pain after throwing motion this morning, playing clown like with her daughter. Best films possible. Patient in a lot of pain. Obvious deformity. EXAM: LEFT HUMERUS - 2+ VIEW COMPARISON:  None. FINDINGS: Oblique fracture through the midshaft of the left humerus. Approximately 2.3 cm override of fracture fragments with minimal angulation. No definite underlying pathologic lesion. Regional soft tissue swelling. IMPRESSION: Mid humeral transverse fracture with 2.3 cm override of fracture fragments. Electronically Signed   By: Corlis Leak M.D.   On: 06/17/2017 11:40    Procedures Procedures (including critical care time)    ++++++++++++++++++++++++++++++  SPLINT APPLICATION Authorized by: Azalia Bilis Consent: Verbal consent obtained. Risks and benefits: risks, benefits and alternatives were discussed Consent given by: patient Splint applied by: orthopedic technician Location details: Left upper arm Splint type: Coaptation splint Supplies used: Ortho-Glass Post-procedure: The splinted body part was neurovascularly unchanged following the procedure. Patient tolerance: Patient tolerated the procedure well with no immediate complications.   +++++++++++++++++++++++++++++++++++++++++    Medications Ordered in ED Medications  morphine 4 MG/ML injection 8 mg (8 mg Intravenous Given 06/17/17 1104)  ondansetron (ZOFRAN) injection 4 mg (4 mg Intravenous Given 06/17/17 1105)     Initial Impression / Assessment and Plan / ED Course  I have reviewed the triage vital signs and the nursing notes.  Pertinent labs & imaging results that were available during my care of the patient were reviewed by me and considered in my medical decision making (see chart for details).     Midshaft left humerus fracture.  This is a very bizarre injury pattern given her action at the time.  No  evidence to suggest pathologic fracture at that time based on plain film.  I personally reviewed the patient's x-ray which shows the midshaft and slightly angulated mid humerus fracture.  Splint applied: Coaptation splint.  Consultants, discussed with Dr. Dion Saucier of orthopedics who will see the patient the office.  Films were personally reviewed by him.  He agrees he sees no signs to suggest pathologic fracture at this time.  No indication for CT or MRI at this time for further evaluation.  Just close orthopedic office follow-up.  Patient updated.  All questions answered.  Improved pain in the coaptation splint.  Final Clinical Impressions(s) / ED Diagnoses   Final diagnoses:  Closed displaced transverse fracture of shaft of left humerus, initial encounter    ED Discharge Orders        Ordered  oxyCODONE-acetaminophen (PERCOCET/ROXICET) 5-325 MG tablet  Every 4 hours PRN     06/17/17 1324    docusate sodium (COLACE) 100 MG capsule  Every 12 hours     06/17/17 1324       Azalia Bilis, MD 06/17/17 1333

## 2017-06-17 NOTE — ED Notes (Signed)
ED Provider at bedside. 

## 2017-06-17 NOTE — ED Notes (Signed)
Patient transported to X-ray 

## 2017-06-17 NOTE — ED Triage Notes (Signed)
Left arm pain after throwing motion this morning, playing clown like with her daughter. Pin in left humerus.

## 2017-06-18 ENCOUNTER — Emergency Department (HOSPITAL_COMMUNITY)
Admission: EM | Admit: 2017-06-18 | Discharge: 2017-06-18 | Discharge status: Against Medical Advice | Payer: Medicaid Other

## 2017-06-20 ENCOUNTER — Encounter (HOSPITAL_COMMUNITY): Payer: Self-pay | Admitting: Emergency Medicine

## 2017-06-20 ENCOUNTER — Emergency Department (HOSPITAL_COMMUNITY)
Admission: EM | Admit: 2017-06-20 | Discharge: 2017-06-20 | Disposition: A | Discharge status: Home / Self Care | Payer: Medicaid Other | Attending: Emergency Medicine | Admitting: Emergency Medicine

## 2017-06-20 ENCOUNTER — Other Ambulatory Visit: Payer: Self-pay

## 2017-06-20 DIAGNOSIS — Z79899 Other long term (current) drug therapy: Secondary | ICD-10-CM | POA: Insufficient documentation

## 2017-06-20 DIAGNOSIS — X58XXXA Exposure to other specified factors, initial encounter: Secondary | ICD-10-CM | POA: Insufficient documentation

## 2017-06-20 DIAGNOSIS — S42352D Displaced comminuted fracture of shaft of humerus, left arm, subsequent encounter for fracture with routine healing: Secondary | ICD-10-CM | POA: Diagnosis not present

## 2017-06-20 MED ORDER — HYDROCODONE-ACETAMINOPHEN 5-325 MG PO TABS: 1.0000 | ORAL_TABLET | Freq: Four times a day (QID) | ORAL | 0 refills | Status: DC | PRN

## 2017-06-20 NOTE — ED Provider Notes (Addendum)
[facility] Provider Note   CSN: [id number] Arrival date & time: 06/20/17  2222     History   Chief Complaint Chief Complaint  Patient presents with  . Arm Pain    HPI Haley Scott is a 22 y.o. female.  Patient presents to the ED with a chief complaint left hand swelling.  She states that she was seen recently for arm injury and sustained a left humerus fracture.  She states that she was splinted and has been taking percocet for pain.  She states that today she noticed increasing swelling in her hand.  She came to have her splint evaluated.  She is going to be seen by orthopedics on Monday.  She reports that she has also run out of her percocet, but reports having significant itching with it.  The history is provided by the patient. No language interpreter was used.    Past Medical History:  Diagnosis Date  . History of postpartum hemorrhage 11/01/2013  . Umbilical hernia 2001  . UTI in pregnancy, antepartum 11/12/2015   @19  wk- E coli UTI--> treated with Keflex [x]  TOC neg +mid nov [x]  toc late December-->january: neg    Patient Active Problem List   Diagnosis Date Noted  . Low grade squamous intraepith lesion on cytologic smear cervix (lgsil) 06/19/2016  . Trichomonal vaginitis during pregnancy 04/03/2016  . 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 Supervision of normal pregnancy 10/11/2015    Past Surgical History:  Procedure Laterality Date  . HERNIA REPAIR       OB History    Gravida  2   Para  2   Term  2   Preterm  0   AB  0   Living  2     SAB  0   TAB  0   Ectopic  0   Multiple  0   Live Births  2            Home Medications    Prior to Admission medications   Medication Sig Start Date End Date Taking? Authorizing Provider  docusate sodium (COLACE) 100 MG capsule Take 1 capsule (100 mg total) by mouth every 12 (twelve) hours. 06/17/17   Azalia Bilisampos, Kevin, MD  Multiple Vitamins-Minerals  (WOMENS DAILY FORMULA PO) Take 1 tablet by mouth daily.    [provider]  oxyCODONE-acetaminophen (PERCOCET/ROXICET) 5-325 MG tablet Take 1 tablet by mouth every 4 (four) hours as needed for severe pain. 06/17/17   Azalia Bilisampos, Kevin, MD  valACYclovir (VALTREX) 500 MG tablet Take 1 tablet (500 mg total) by mouth daily. Can increase to 2 tablets (1000mg ) twice daily for 7 days during recurrence. Patient not taking: Reported on 06/17/2017 11/07/15   Federico FlakeNewton, Kimberly Niles, MD    Family History History reviewed. No pertinent family history.  Social History Social History   Tobacco Use  . Smoking status: Never Smoker  . Smokeless tobacco: Never Used  Substance Use Topics  . Alcohol use: No  . Drug use: No     Allergies   Ibuprofen   Review of Systems Review of Systems  All other systems reviewed and are negative.    Physical Exam Updated Vital Signs BP 122/78 (BP Location: Left Arm)   Pulse 73   Temp 98.1 F (36.7 C) (Oral)   Resp 18   SpO2 100%   Physical Exam  Constitutional: She is oriented to person, place, and time. No distress.  HENT:  Head: Normocephalic  and atraumatic.  Eyes: Pupils are equal, round, and reactive to light. Conjunctivae and EOM are normal.  Neck: No tracheal deviation present.  Cardiovascular: Normal rate and intact distal pulses.  Intact distal pulses  Pulmonary/Chest: Effort normal. No respiratory distress.  Abdominal: Soft.  Musculoskeletal: Normal range of motion.  Mild swelling of left hand Forearm compartments are soft ROM and strength of left wrist intact  Neurological: She is alert and oriented to person, place, and time.  Skin: Skin is warm and dry. She is not diaphoretic.  Brisk cap refill  Psychiatric: Judgment normal.  Nursing note and vitals reviewed.    ED Treatments / Results  Labs (all labs ordered are listed, but only abnormal results are displayed) Labs Reviewed - No data to display  EKG None  Radiology No  results found.  Procedures Procedures (including critical care time) SPLINT APPLICATION Date/Time: 11:39 PM Authorized by: Roxy Horseman Consent: Verbal consent obtained. Risks and benefits: risks, benefits and alternatives were discussed Consent given by: patient Splint applied by: orthopedic technician Location details: left arm Splint type: coaptation and posterior long arm Supplies used: orthoglass Post-procedure: The splinted body part was neurovascularly unchanged following the procedure. Patient tolerance: Patient tolerated the procedure well with no immediate complications.    Medications Ordered in ED Medications - No data to display   Initial Impression / Assessment and Plan / ED Course  I have reviewed the triage vital signs and the nursing notes.  Pertinent labs & imaging results that were available during my care of the patient were reviewed by me and considered in my medical decision making (see chart for details).     Patient with humerus fracture.  In coaptation splint.  Has had some swelling into her hand.  Compartments are soft.  Neurovascularly intact.  Will add posterior long arm to keep hand elevated and swelling out.  Has ortho follow-up on Monday.  Final Clinical Impressions(s) / ED Diagnoses   Final diagnoses:  Closed displaced comminuted fracture of shaft of left humerus with routine healing, subsequent encounter    ED Discharge Orders        Ordered    HYDROcodone-acetaminophen (NORCO/VICODIN) 5-325 MG tablet  Every 6 hours PRN     06/20/17 2337       Roxy Horseman, PA-C 06/20/17 2338    Roxy Horseman, PA-C 06/20/17 2339    Ward, Layla Maw, DO 06/21/17 1610

## 2017-06-20 NOTE — ED Triage Notes (Signed)
Pt reports increased pain and swelling in l/forearm. Pt is concerned that the wrap on l/arm may ber too tight

## 2017-06-20 NOTE — ED Notes (Signed)
Bed: WTR8 Expected date:  Expected time:  Means of arrival:  Comments: 

## 2017-12-20 ENCOUNTER — Encounter (HOSPITAL_COMMUNITY): Payer: Self-pay | Admitting: Emergency Medicine

## 2017-12-20 ENCOUNTER — Other Ambulatory Visit: Payer: Self-pay

## 2017-12-20 ENCOUNTER — Emergency Department (HOSPITAL_COMMUNITY)
Admission: EM | Admit: 2017-12-20 | Discharge: 2017-12-20 | Disposition: A | Discharge status: Home / Self Care | Payer: Medicaid Other | Attending: Emergency Medicine | Admitting: Emergency Medicine

## 2017-12-20 DIAGNOSIS — H1032 Unspecified acute conjunctivitis, left eye: Secondary | ICD-10-CM | POA: Diagnosis not present

## 2017-12-20 DIAGNOSIS — H5789 Other specified disorders of eye and adnexa: Secondary | ICD-10-CM | POA: Diagnosis present

## 2017-12-20 DIAGNOSIS — Z79899 Other long term (current) drug therapy: Secondary | ICD-10-CM | POA: Diagnosis not present

## 2017-12-20 MED ORDER — ERYTHROMYCIN 5 MG/GM OP OINT
TOPICAL_OINTMENT | Freq: Once | OPHTHALMIC | Status: AC
  Administered 2017-12-20: 1 via OPHTHALMIC
  Filled 2017-12-20: qty 3.5

## 2017-12-20 MED ORDER — ERYTHROMYCIN 5 MG/GM OP OINT: TOPICAL_OINTMENT | OPHTHALMIC | 0 refills | Status: DC

## 2017-12-20 NOTE — ED Triage Notes (Signed)
Pt c/o L eye redness and discharge x 2 days.

## 2017-12-20 NOTE — ED Provider Notes (Signed)
[facility]PT Provider Note   CSN: [id number] Arrival date & time: 12/20/17  1011     History   Chief Complaint Chief Complaint  Patient presents with  . Conjunctivitis    HPI Haley Scott is a 22 y.o. female.  The history is provided by the patient.  Conjunctivitis  This is a new problem. The current episode started 2 days ago. The problem occurs daily. The problem has not changed since onset.Pertinent negatives include no chest pain, no abdominal pain, no headaches and no shortness of breath. Nothing aggravates the symptoms. Nothing relieves the symptoms. She has tried nothing for the symptoms. The treatment provided no relief.    Past Medical History:  Diagnosis Date  . History of postpartum hemorrhage 11/01/2013  . Umbilical hernia 2001  . UTI in pregnancy, antepartum 11/12/2015   @19  wk- E coli UTI--> treated with Keflex [x]  TOC neg +mid nov [x]  toc late December-->january: neg    Patient Active Problem List   Diagnosis Date Noted  . Low grade squamous intraepith lesion on cytologic smear cervix (lgsil) 06/19/2016  . Trichomonal vaginitis during pregnancy 04/03/2016  . 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 Supervision of normal pregnancy 10/11/2015    Past Surgical History:  Procedure Laterality Date  . HERNIA REPAIR       OB History    Gravida  2   Para  2   Term  2   Preterm  0   AB  0   Living  2     SAB  0   TAB  0   Ectopic  0   Multiple  0   Live Births  2            Home Medications    Prior to Admission medications   Medication Sig Start Date End Date Taking? Authorizing Provider  docusate sodium (COLACE) 100 MG capsule Take 1 capsule (100 mg total) by mouth every 12 (twelve) hours. 06/17/17   Azalia Bilis, MD  erythromycin ophthalmic ointment Place a 1/2 inch ribbon of ointment into the lower eyelid twice a day until cleared 12/20/17   Virgina Norfolk, DO    HYDROcodone-acetaminophen (NORCO/VICODIN) 5-325 MG tablet Take 1-2 tablets by mouth every 6 (six) hours as needed. 06/20/17   Roxy Horseman, PA-C  Multiple Vitamins-Minerals (WOMENS DAILY FORMULA PO) Take 1 tablet by mouth daily.    [provider]  oxyCODONE-acetaminophen (PERCOCET/ROXICET) 5-325 MG tablet Take 1 tablet by mouth every 4 (four) hours as needed for severe pain. 06/17/17   Azalia Bilis, MD  valACYclovir (VALTREX) 500 MG tablet Take 1 tablet (500 mg total) by mouth daily. Can increase to 2 tablets (1000mg ) twice daily for 7 days during recurrence. Patient not taking: Reported on 06/17/2017 11/07/15   Federico Flake, MD    Family History History reviewed. No pertinent family history.  Social History Social History   Tobacco Use  . Smoking status: Never Smoker  . Smokeless tobacco: Never Used  Substance Use Topics  . Alcohol use: No  . Drug use: No     Allergies   Ibuprofen   Review of Systems Review of Systems  Constitutional: Negative for chills and fever.  HENT: Negative for ear pain and sore throat.   Eyes: Positive for discharge and redness. Negative for pain and visual disturbance.  Respiratory: Negative for cough and shortness of breath.   Cardiovascular: Negative for chest pain and palpitations.  Gastrointestinal: Negative for  abdominal pain and vomiting.  Genitourinary: Negative for dysuria and hematuria.  Musculoskeletal: Negative for arthralgias and back pain.  Skin: Negative for color change and rash.  Neurological: Negative for seizures, syncope and headaches.  All other systems reviewed and are negative.    Physical Exam Updated Vital Signs BP 116/72 (BP Location: Left Arm)   Pulse 100   Temp 98.6 F (37 C) (Oral)   Resp 16   Ht 5' (1.524 m)   Wt 50.8 kg   SpO2 100%   BMI 21.87 kg/m   Physical Exam  Constitutional: She appears well-developed and well-nourished. No distress.  HENT:  Head: Normocephalic and  atraumatic.  Eyes: Pupils are equal, round, and reactive to light. EOM are normal. Right eye exhibits no discharge. Left eye exhibits discharge. Right conjunctiva is not injected. Right conjunctiva has no hemorrhage. Left conjunctiva is injected. Left conjunctiva has no hemorrhage. Right eye exhibits normal extraocular motion and no nystagmus. Left eye exhibits normal extraocular motion and no nystagmus.  Neck: Neck supple.  Cardiovascular: Normal rate and regular rhythm.  No murmur heard. Pulmonary/Chest: Effort normal and breath sounds normal. No respiratory distress.  Abdominal: Soft. There is no tenderness.  Musculoskeletal: She exhibits no edema.  Neurological: She is alert.  Skin: Skin is warm and dry.  Psychiatric: She has a normal mood and affect.  Nursing note and vitals reviewed.    ED Treatments / Results  Labs (all labs ordered are listed, but only abnormal results are displayed) Labs Reviewed - No data to display  EKG None  Radiology No results found.  Procedures Procedures (including critical care time)  Medications Ordered in ED Medications  erythromycin ophthalmic ointment (has no administration in time range)     Initial Impression / Assessment and Plan / ED Course  I have reviewed the triage vital signs and the nursing notes.  Pertinent labs & imaging results that were available during my care of the patient were reviewed by me and considered in my medical decision making (see chart for details).     Haley Scott is a 22 year old female who presents to the ED with left eye redness.  Patient with no fever, normal vitals.  Eye redness started 2 days ago.  No trauma.  States that her left eye is crusted over in the morning.  Exam is consistent with conjunctivitis.  Patient likely with pinkeye.  Extraocular movements are intact.  Patient denies any pain.  No concern for abrasion.  Patient given erythromycin ointment.  Discharged from ED in good condition.   Educated on pinkeye.  Discharged from ED in good condition.  Recommend follow-up as needed.  Final Clinical Impressions(s) / ED Diagnoses   Final diagnoses:  Acute conjunctivitis of left eye, unspecified acute conjunctivitis type    ED Discharge Orders         Ordered    erythromycin ophthalmic ointment     12/20/17 1044           Trenese Haft, DO 12/20/17 1046

## 2018-01-21 ENCOUNTER — Emergency Department (HOSPITAL_COMMUNITY)
Admission: EM | Admit: 2018-01-21 | Discharge: 2018-01-21 | Disposition: A | Discharge status: Home / Self Care | Payer: Medicaid Other | Attending: Emergency Medicine | Admitting: Emergency Medicine

## 2018-01-21 ENCOUNTER — Other Ambulatory Visit: Payer: Self-pay

## 2018-01-21 DIAGNOSIS — N3091 Cystitis, unspecified with hematuria: Secondary | ICD-10-CM

## 2018-01-21 DIAGNOSIS — R300 Dysuria: Secondary | ICD-10-CM | POA: Diagnosis present

## 2018-01-21 DIAGNOSIS — N3001 Acute cystitis with hematuria: Secondary | ICD-10-CM | POA: Insufficient documentation

## 2018-01-21 LAB — URINALYSIS, ROUTINE W REFLEX MICROSCOPIC
BACTERIA UA: NONE SEEN
BILIRUBIN URINE: NEGATIVE
GLUCOSE, UA: NEGATIVE mg/dL
KETONES UR: NEGATIVE mg/dL
Nitrite: NEGATIVE
PH: 7 (ref 5.0–8.0)
PROTEIN: 100 mg/dL — AB
Specific Gravity, Urine: 1.017 (ref 1.005–1.030)

## 2018-01-21 LAB — POC URINE PREG, ED: Preg Test, Ur: NEGATIVE

## 2018-01-21 MED ORDER — PHENAZOPYRIDINE HCL 200 MG PO TABS: 200.0000 mg | ORAL_TABLET | Freq: Three times a day (TID) | ORAL | 0 refills | Status: DC | PRN

## 2018-01-21 MED ORDER — PHENAZOPYRIDINE HCL 200 MG PO TABS
200.0000 mg | ORAL_TABLET | Freq: Once | ORAL | Status: AC
  Administered 2018-01-21: 200 mg via ORAL
  Filled 2018-01-21: qty 1

## 2018-01-21 MED ORDER — CEPHALEXIN 500 MG PO CAPS: 500.0000 mg | ORAL_CAPSULE | Freq: Two times a day (BID) | ORAL | 0 refills | Status: DC

## 2018-01-21 MED ORDER — CEPHALEXIN 500 MG PO CAPS
1000.0000 mg | ORAL_CAPSULE | Freq: Once | ORAL | Status: AC
  Administered 2018-01-21: 1000 mg via ORAL
  Filled 2018-01-21: qty 2

## 2018-01-21 NOTE — ED Provider Notes (Signed)
[facility]-EMERGENCY DEPT Provider Note: Haley Scott Marguerette Sheller, MD, FACEP  CSN: [id number] MRN: [id number] ARRIVAL: 01/21/18 at 0400 ROOM: WA14/WA14   CHIEF COMPLAINT  Dysuria   HISTORY OF PRESENT ILLNESS  01/21/18 4:16 AM Haley Scott is a 22 y.o. female who is here with a 2-day history of dysuria by which she means severe burning with urination.  She is also having urinary urgency, frequency and voiding of small amounts.  She is developed low back pain as well as pain in her right upper quadrant radiating to her right flank.  The pain is not severe presently.  She has noted hematuria.  She denies fever, chills, nausea, vomiting or diarrhea.   Past Medical History:  Diagnosis Date  . History of postpartum hemorrhage 11/01/2013  . Umbilical hernia 2001  . UTI in pregnancy, antepartum 11/12/2015   @19  wk- E coli UTI--> treated with Keflex [x]  TOC neg +mid nov [x]  toc late December-->january: neg    Past Surgical History:  Procedure Laterality Date  . HERNIA REPAIR      No family history on file.  Social History   Tobacco Use  . Smoking status: Never Smoker  . Smokeless tobacco: Never Used  Substance Use Topics  . Alcohol use: No  . Drug use: No    Prior to Admission medications   Medication Sig Start Date End Date Taking? Authorizing Provider  docusate sodium (COLACE) 100 MG capsule Take 1 capsule (100 mg total) by mouth every 12 (twelve) hours. Patient not taking: Reported on 01/21/2018 06/17/17   Azalia Bilisampos, Kevin, MD  erythromycin ophthalmic ointment Place a 1/2 inch ribbon of ointment into the lower eyelid twice a day until cleared Patient not taking: Reported on 01/21/2018 12/20/17   Virgina Norfolkuratolo, Adam, DO  HYDROcodone-acetaminophen (NORCO/VICODIN) 5-325 MG tablet Take 1-2 tablets by mouth every 6 (six) hours as needed. Patient not taking: Reported on 01/21/2018 06/20/17   Roxy HorsemanBrowning, Robert, PA-C  oxyCODONE-acetaminophen (PERCOCET/ROXICET) 5-325 MG tablet Take 1 tablet by mouth every 4  (four) hours as needed for severe pain. Patient not taking: Reported on 01/21/2018 06/17/17   Azalia Bilisampos, Kevin, MD  valACYclovir (VALTREX) 500 MG tablet Take 1 tablet (500 mg total) by mouth daily. Can increase to 2 tablets (1000mg ) twice daily for 7 days during recurrence. Patient not taking: Reported on 06/17/2017 11/07/15   Federico FlakeNewton, Kimberly Niles, MD    Allergies Ibuprofen   REVIEW OF SYSTEMS  Negative except as noted here or in the History of Present Illness.   PHYSICAL EXAMINATION  Initial Vital Signs Blood pressure 110/74, pulse 89, temperature 98.6 F (37 C), resp. rate 16, SpO2 100 %, currently breastfeeding.  Examination General: Well-developed, well-nourished female in no acute distress; appearance consistent with age of record HENT: normocephalic; atraumatic Eyes: pupils equal, round and reactive to light; extraocular muscles intact Neck: supple Heart: regular rate and rhythm Lungs: clear to auscultation bilaterally Abdomen: soft; nondistended; mild right upper quadrant tenderness; no masses or hepatosplenomegaly; bowel sounds present GU: Mild right CVA tenderness Extremities: No deformity; full range of motion; pulses normal Neurologic: Awake, alert and oriented; motor function intact in all extremities and symmetric; no facial droop Skin: Warm and dry Psychiatric: Normal mood and affect   RESULTS  Summary of this visit's results, reviewed by myself:   EKG Interpretation  Date/Time:    Ventricular Rate:    PR Interval:    QRS Duration:   QT Interval:    QTC Calculation:   R Axis:     Text  Interpretation:        Laboratory Studies: Results for orders placed or performed during the hospital encounter of 01/21/18 (from the past 24 hour(s))  Urinalysis, Routine w reflex microscopic     Status: Abnormal   Collection Time: 01/21/18  4:59 AM  Result Value Ref Range   Color, Urine STRAW (A) YELLOW   APPearance CLOUDY (A) CLEAR   Specific Gravity, Urine 1.017  1.005 - 1.030   pH 7.0 5.0 - 8.0   Glucose, UA NEGATIVE NEGATIVE mg/dL   Hgb urine dipstick SMALL (A) NEGATIVE   Bilirubin Urine NEGATIVE NEGATIVE   Ketones, ur NEGATIVE NEGATIVE mg/dL   Protein, ur 161 (A) NEGATIVE mg/dL   Nitrite NEGATIVE NEGATIVE   Leukocytes, UA LARGE (A) NEGATIVE   RBC / HPF >50 (H) 0 - 5 RBC/hpf   WBC, UA 6-10 0 - 5 WBC/hpf   Bacteria, UA NONE SEEN NONE SEEN   Squamous Epithelial / LPF 0-5 0 - 5   WBC Clumps PRESENT    Mucus PRESENT    Amorphous Crystal PRESENT   POC urine preg, ED (not at [facility])     Status: None   Collection Time: 01/21/18  5:03 AM  Result Value Ref Range   Preg Test, Ur NEGATIVE NEGATIVE   Imaging Studies: No results found.  ED COURSE and MDM  Nursing notes and initial vitals signs, including pulse oximetry, reviewed.  Vitals:   01/21/18 0406  BP: 110/74  Pulse: 89  Resp: 16  Temp: 98.6 F (37 C)  SpO2: 100%   5:49 AM History and urinalysis consistent with urinary tract infection.  Significant blood in urine suggests hemorrhagic cystitis.  Patient has minimal right CVA tenderness I doubt pyelonephritis at this time.  PROCEDURES    ED DIAGNOSES     ICD-10-CM   1. Hemorrhagic cystitis N30.91        Kenith Trickel, Jonny Ruiz, MD 01/21/18 [phone]

## 2018-01-21 NOTE — ED Triage Notes (Signed)
Pt reports suprapubic pain that radiates to flank. Pt reports blood when wiping and nausea.

## 2018-01-23 LAB — URINE CULTURE

## 2018-01-24 ENCOUNTER — Telehealth: Payer: Self-pay

## 2018-01-24 NOTE — Telephone Encounter (Signed)
Post ED Visit - Positive Culture Follow-up  Culture report reviewed by antimicrobial stewardship pharmacist:  []  Enzo BiNathan Batchelder, Pharm.D. []  Celedonio MiyamotoJeremy Frens, Pharm.D., BCPS AQ-ID []  Garvin FilaMike Maccia, Pharm.D., BCPS []  Georgina PillionElizabeth Martin, Pharm.D., BCPS []  New MarketMinh Pham, VermontPharm.D., BCPS, AAHIVP []  Estella HuskMichelle Turner, Pharm.D., BCPS, AAHIVP []  Lysle Pearlachel Rumbarger, PharmD, BCPS []  Phillips Climeshuy Dang, PharmD, BCPS [x]  Agapito GamesAlison Masters, PharmD, BCPS []  Verlan FriendsErin Deja, PharmD  Positive urine culture Treated with Cephalexin, organism sensitive to the same and no further patient follow-up is required at this time.  Jerry CarasCullom, Ellora Varnum Burnett 01/24/2018, 9:25 AM

## 2018-01-27 ENCOUNTER — Other Ambulatory Visit: Payer: Self-pay

## 2018-01-27 MED ORDER — VALACYCLOVIR HCL 500 MG PO TABS: 500.0000 mg | ORAL_TABLET | Freq: Two times a day (BID) | ORAL | 1 refills | Status: DC

## 2018-01-27 NOTE — Telephone Encounter (Signed)
Patient is requesting a refill on val[facility]x be [facility]. Patient was made aware she needs a Annual Exam

## 2018-02-11 ENCOUNTER — Emergency Department (HOSPITAL_COMMUNITY)
Admission: EM | Admit: 2018-02-11 | Discharge: 2018-02-11 | Disposition: A | Discharge status: Home / Self Care | Payer: Medicaid Other | Attending: Emergency Medicine | Admitting: Emergency Medicine

## 2018-02-11 ENCOUNTER — Emergency Department (HOSPITAL_COMMUNITY): Payer: Medicaid Other

## 2018-02-11 ENCOUNTER — Encounter (HOSPITAL_COMMUNITY): Payer: Self-pay | Admitting: Emergency Medicine

## 2018-02-11 DIAGNOSIS — M79602 Pain in left arm: Secondary | ICD-10-CM | POA: Diagnosis not present

## 2018-02-11 MED ORDER — ACETAMINOPHEN ER 650 MG PO TBCR: 650.0000 mg | EXTENDED_RELEASE_TABLET | Freq: Three times a day (TID) | ORAL | 0 refills | Status: DC | PRN

## 2018-02-11 NOTE — ED Triage Notes (Signed)
Pt reports left upper arm pain that started today after waking up. Denies any falls or injuries.
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
## 2018-02-11 NOTE — Discharge Instructions (Addendum)
You were seen in the emergency department today for left arm pain.  Your x-ray did not show any new fractures or dislocation, it does show healing of your old fracture without any significant changes.  It is possible that this is a muscular issue. Please take tylenol for pain.  We have prescribed you new medication(s) today. Discuss the medications prescribed today with your pharmacist as they can have adverse effects and interactions with your other medicines including over the counter and prescribed medications. Seek medical evaluation if you start to experience new or abnormal symptoms after taking one of these medicines, seek care immediately if you start to experience difficulty breathing, feeling of your throat closing, facial swelling, or rash as these could be indications of a more serious allergic reaction   Please call Dr. Veda Canninghandler's office to make a follow-up appointment within the next 1 week for reevaluation.  Return to the ER for new or worsening symptoms or any other concerns.

## 2018-03-08 ENCOUNTER — Ambulatory Visit (INDEPENDENT_AMBULATORY_CARE_PROVIDER_SITE_OTHER): Payer: Medicaid Other | Admitting: Obstetrics and Gynecology

## 2018-03-08 ENCOUNTER — Encounter: Payer: Self-pay | Admitting: Obstetrics and Gynecology

## 2018-03-08 ENCOUNTER — Other Ambulatory Visit (HOSPITAL_COMMUNITY)
Admission: RE | Admit: 2018-03-08 | Discharge: 2018-03-08 | Disposition: A | Discharge status: Home / Self Care | Payer: Medicaid Other | Source: Ambulatory Visit | Attending: Obstetrics and Gynecology | Admitting: Obstetrics and Gynecology

## 2018-03-08 VITALS — BP 96/58 | HR 72 | Wt 108.4 lb

## 2018-03-08 DIAGNOSIS — R87612 Low grade squamous intraepithelial lesion on cytologic smear of cervix (LGSIL): Secondary | ICD-10-CM | POA: Insufficient documentation

## 2018-03-08 DIAGNOSIS — T8332XA Displacement of intrauterine contraceptive device, initial encounter: Secondary | ICD-10-CM | POA: Insufficient documentation

## 2018-03-08 DIAGNOSIS — Z01419 Encounter for gynecological examination (general) (routine) without abnormal findings: Secondary | ICD-10-CM

## 2018-03-08 DIAGNOSIS — Z0000 Encounter for general adult medical examination without abnormal findings: Secondary | ICD-10-CM

## 2018-03-08 NOTE — Progress Notes (Signed)
Obstetrics and Gynecology Annual Patient Evaluation  Appointment Date: 03/08/2018  OBGYN Clinic: [facility]  Primary Care Provider: Patient, No Pcp Per  Referring Provider: No ref. provider found  Chief Complaint:  Chief Complaint  Patient presents with  . Gynecologic Exam    History of Present Illness: Haley Scott is a 23 y.o. African-American G2P2002 (No LMP recorded. (Menstrual status: IUD).), seen for the above chief complaint. Her past medical history is significant for h/o STI  Dryness with intercourse and ?vaginal smell past few days with no discharge. No prior h/o similar s/s.    No breast s/s, nausea, vomiting, abdominal pain, dysuria, hematuria, vaginal itching, dyspareunia, diarrhea, constipation, blood in BMs  Review of Systems:  as noted in the History of Present Illness.  Past Medical History:  Past Medical History:  Diagnosis Date  . History of postpartum hemorrhage 11/01/2013  . Umbilical hernia 2001  . UTI in pregnancy, antepartum 11/12/2015   @19  wk- E coli UTI--> treated with Keflex [x]  TOC neg +mid nov [x]  toc late December-->january: neg    Past Surgical History:  Past Surgical History:  Procedure Laterality Date  . HERNIA REPAIR      Past Obstetrical History:  OB History  Gravida Para Term Preterm AB Living  2 2 2  0 0 2  SAB TAB Ectopic Multiple Live Births  0 0 0 0 2    # Outcome Date GA Lbr Len/2nd Weight Sex Delivery Anes PTL Lv  2 Term 04/08/16 [redacted]w[redacted]d 10:15 / 00:06 6 lb 14.8 oz (3.14 kg) F Vag-Spont None  LIV  1 Term 10/29/13     Vag-Spont   LIV     Complications: Postpartum hemorrhage    Past Gynecological History: As per HPI. Periods: has had two periods (a few days, spotting) since IUD was placed History of Pap Smear(s): Yes.   Last pap 2018, which was LSIL History of STI(s): Yes.   She is currently using Liletta (placed 2018) for contraception.   Social History:  Social History   Socioeconomic History  .  Marital status: Single    Spouse name: Not on file  . Number of children: Not on file  . Years of education: Not on file  . Highest education level: Not on file  Occupational History  . Not on file  Social Needs  . Financial resource strain: Not on file  . Food insecurity:    Worry: Not on file    Inability: Not on file  . Transportation needs:    Medical: Not on file    Non-medical: Not on file  Tobacco Use  . Smoking status: Never Smoker  . Smokeless tobacco: Never Used  Substance and Sexual Activity  . Alcohol use: No  . Drug use: No  . Sexual activity: Yes    Birth control/protection: I.U.D.  Lifestyle  . Physical activity:    Days per week: Not on file    Minutes per session: Not on file  . Stress: Not on file  Relationships  . Social connections:    Talks on phone: Not on file    Gets together: Not on file    Attends religious service: Not on file    Active member of club or organization: Not on file    Attends meetings of clubs or organizations: Not on file    Relationship status: Not on file  . Intimate partner violence:    Fear of current or ex partner: Not on file  Emotionally abused: Not on file    Physically abused: Not on file    Forced sexual activity: Not on file  Other Topics Concern  . Not on file  Social History Narrative  . Not on file    Family History:  She denies any female cancers  Medications Debbi Tukes had no medications administered during this visit. Current Outpatient Medications  Medication Sig Dispense Refill  . valACYclovir (VALTREX) 500 MG tablet Take 1 tablet (500 mg total) by mouth 2 (two) times daily. 60 tablet 1   No current facility-administered medications for this visit.     Allergies Ibuprofen   Physical Exam:  BP (!) 96/58   Pulse 72   Wt 108 lb 6.4 oz (49.2 kg)   BMI 21.17 kg/m  Body mass index is 21.17 kg/m. General appearance: Well nourished, well developed female in no acute distress.  Neck:   Supple, normal appearance, and no thyromegaly  Cardiovascular: normal s1 and s2.  No murmurs, rubs or gallops. Respiratory:  Clear to auscultation bilateral. Normal respiratory effort Abdomen: positive bowel sounds and no masses, hernias; diffusely non tender to palpation, non distended Breasts: breasts appear normal, no suspicious masses, no skin or nipple changes or axillary nodes, and normal palpation. Neuro/Psych:  Normal mood and affect.  Skin:  Warm and dry.  Lymphatic:  No inguinal lymphadenopathy.   Pelvic exam: is not limited by body habitus EGBUS: within normal limits, Vagina: within normal limits and with no blood or discharge in the vault, Cervix: IUD strings NOT seen.  normal appearing cervix without tenderness, discharge or lesions. Uterus:  nonenlarged and non tender and Adnexa:  normal adnexa and no mass, fullness, tenderness Rectovaginal: deferred  Laboratory: none  Radiology: none  Assessment: pt stable. IUD strings not visible.  Plan:  1. Low grade squamous intraepith lesion on cytologic smear cervix (lgsil) Follow up pap - Cytology - PAP( [facility])  2. Intrauterine contraceptive device threads lost, initial encounter U/s ordered - US PELVIC COMPLETE WITH TRANSVAGINAL; Future  3. Well woman exam Routine labs. Pt would like to check Hgb and do STI testing - CBC - HIV antibody (with reflex) - RPR - Hepatitis B Surface AntiGEN - Hepatitis C Antibody   RTC PRN  Cornelia Copa MD Attending [facility])

## 2018-03-08 NOTE — Progress Notes (Signed)
Vaginal dryness doing intercourse  STD testing

## 2018-03-08 NOTE — Progress Notes (Signed)
Discuss birth control options  STD testing  Abnormal pap CIN-1/ LSIL 2018

## 2018-03-09 LAB — HIV ANTIBODY (ROUTINE TESTING W REFLEX): HIV SCREEN 4TH GENERATION: NONREACTIVE

## 2018-03-09 LAB — CBC
HEMOGLOBIN: 12.1 g/dL (ref 11.1–15.9)
Hematocrit: 36.7 % (ref 34.0–46.6)
MCH: 26.8 pg (ref 26.6–33.0)
MCHC: 33 g/dL (ref 31.5–35.7)
MCV: 81 fL (ref 79–97)
Platelets: 394 10*3/uL (ref 150–450)
RBC: 4.51 x10E6/uL (ref 3.77–5.28)
RDW: 13 % (ref 11.7–15.4)
WBC: 7.2 10*3/uL (ref 3.4–10.8)

## 2018-03-09 LAB — HEPATITIS B SURFACE ANTIGEN: Hepatitis B Surface Ag: NEGATIVE

## 2018-03-09 LAB — HEPATITIS C ANTIBODY: HEP C VIRUS AB: 0.1 {s_co_ratio} (ref 0.0–0.9)

## 2018-03-09 LAB — RPR: RPR: NONREACTIVE

## 2018-03-17 ENCOUNTER — Ambulatory Visit (HOSPITAL_COMMUNITY): Payer: Medicaid Other | Attending: Obstetrics and Gynecology

## 2018-03-18 ENCOUNTER — Telehealth: Payer: Self-pay | Admitting: *Deleted

## 2018-03-18 LAB — CYTOLOGY - PAP
Bacterial vaginitis: POSITIVE — AB
CANDIDA VAGINITIS: NEGATIVE
Chlamydia: NEGATIVE
Diagnosis: UNDETERMINED — AB
HPV (WINDOPATH): DETECTED — AB
HPV 16/18/45 genotyping: NEGATIVE
NEISSERIA GONORRHEA: NEGATIVE
Trichomonas: NEGATIVE

## 2018-03-18 NOTE — Telephone Encounter (Signed)
Called patient back after calling [facility], pt results should be finalized by tomorrow or Monday at the latest.  Will call her when they result.   Scheryl Marten, RN

## 2018-03-18 NOTE — Telephone Encounter (Signed)
-----   Message from Rozann Lesches, NT sent at 03/18/2018 11:33 AM EST ----- Regarding: PAP results Pt had a PAP on 03/08/18 and results are not resulted yet, can you call to see if there is an issue or something and inform patient?  Thanks

## 2018-03-22 ENCOUNTER — Encounter: Payer: Self-pay | Admitting: Obstetrics and Gynecology

## 2018-03-23 ENCOUNTER — Other Ambulatory Visit: Payer: Self-pay

## 2018-03-23 MED ORDER — METRONIDAZOLE 500 MG PO TABS: 500.0000 mg | ORAL_TABLET | Freq: Two times a day (BID) | ORAL | 0 refills | Status: DC

## 2018-03-23 NOTE — Telephone Encounter (Signed)
Patient has been informed of pap results and BV infection. Flagyl has been called into her pharmacy.

## 2018-05-15 IMAGING — US US OB COMP LESS 14 WK
1 series · 15 of 28 positions shown · non-contrast
Comparison: None.

CLINICAL DATA: Pelvic pain for 2 days. Quantitative beta HCG is
pending. Estimated gestational age by LMP is 10 weeks 3 days.

EXAM:
OBSTETRIC <14 WK ULTRASOUND
TECHNIQUE: Transabdominal ultrasound was performed for evaluation of the
gestation as well as the maternal uterus and adnexal regions.

[Series 1: us ob comp less 14 wk · 15 of 33 slices shown]
[im 1/33]
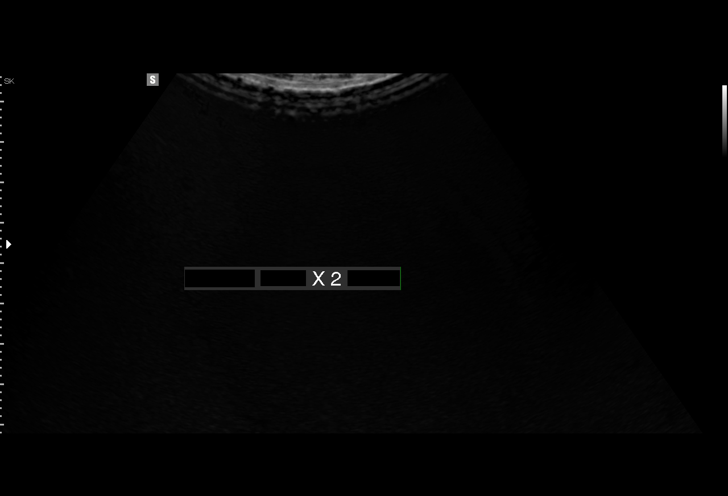
[im 3/33]
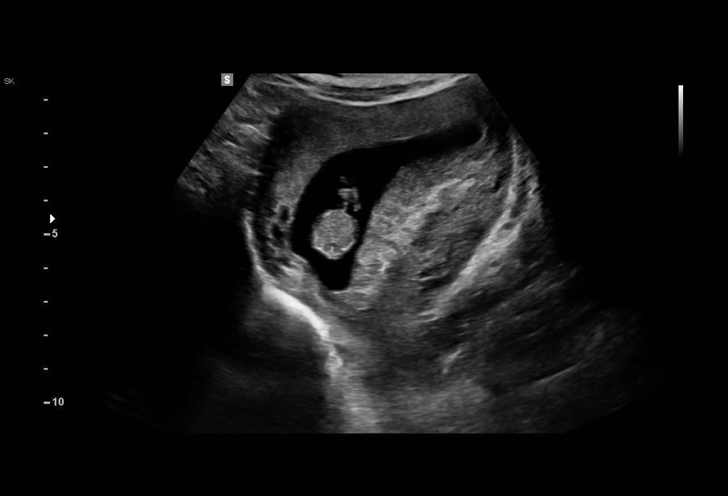
[im 5/33]
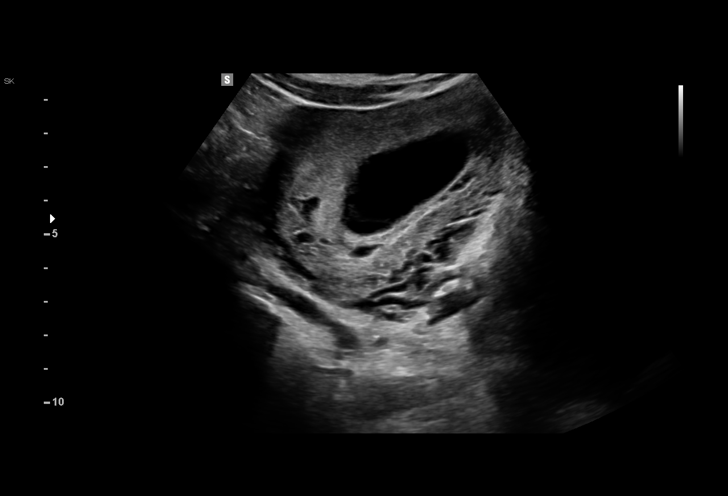
[im 8/33]
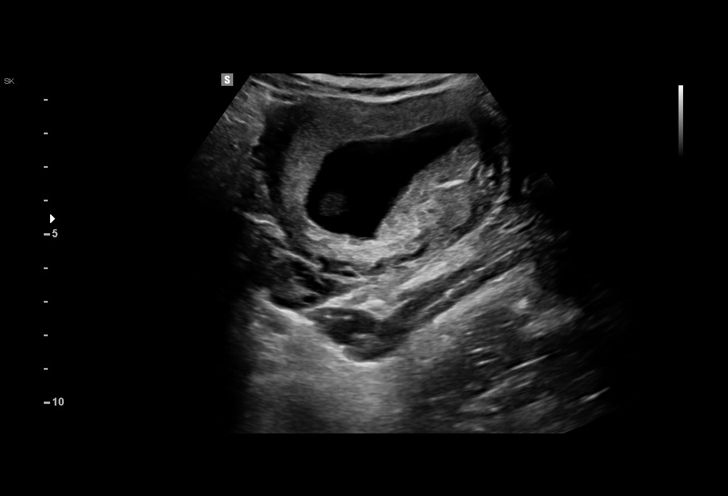
[im 10/33]
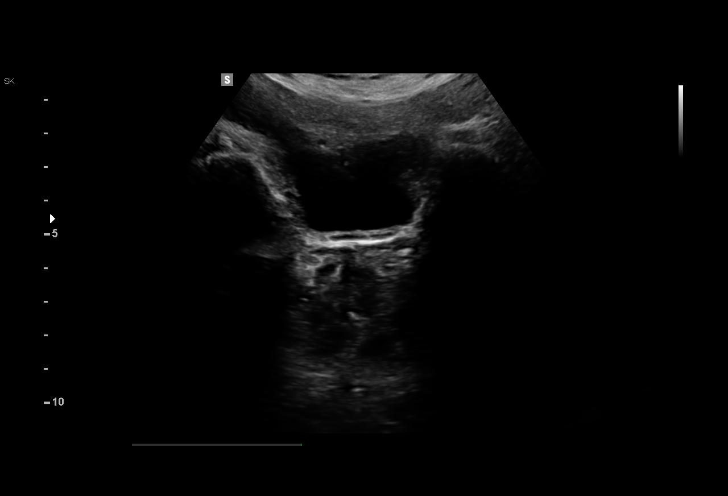
[im 12/33]
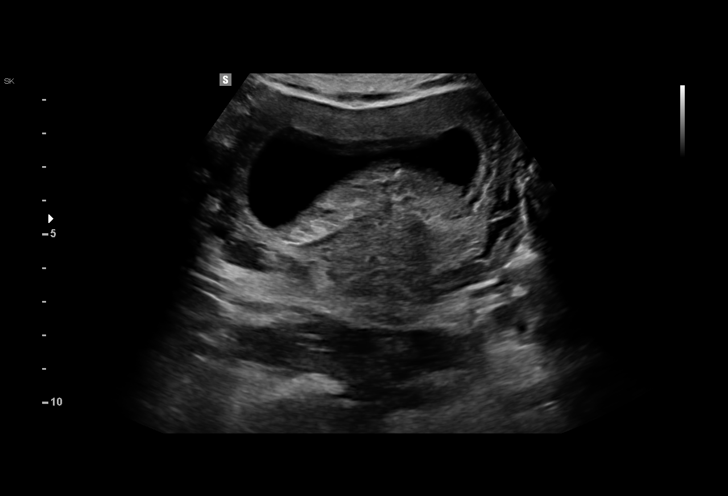
[im 15/33]
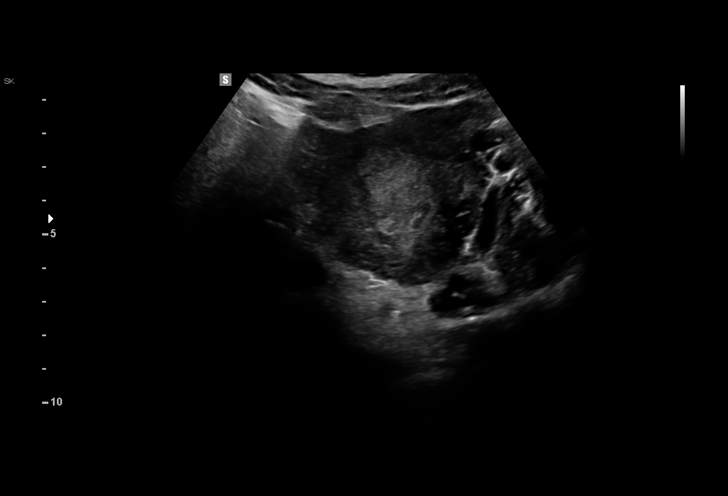
[im 17/33]
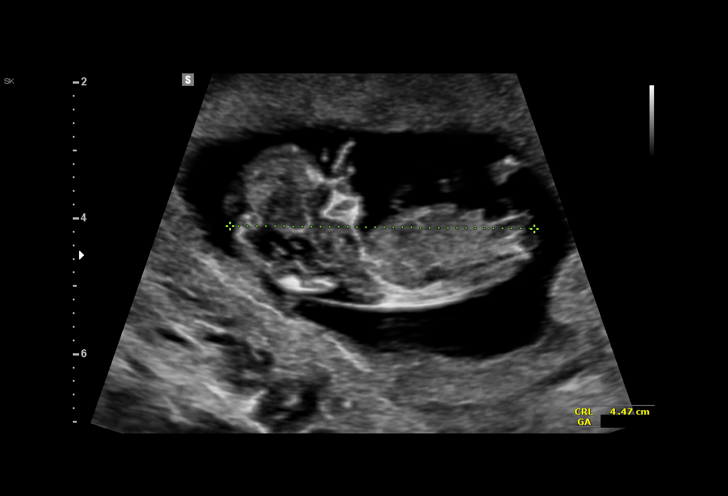
[im 18/33]
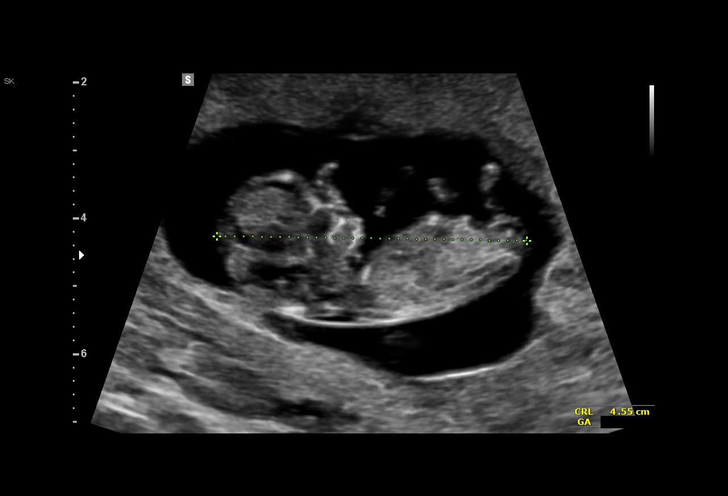
[im 21/33]
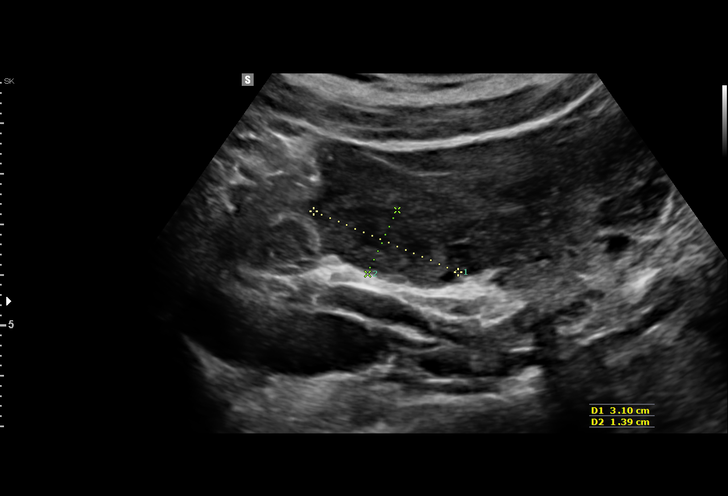
[im 23/33]
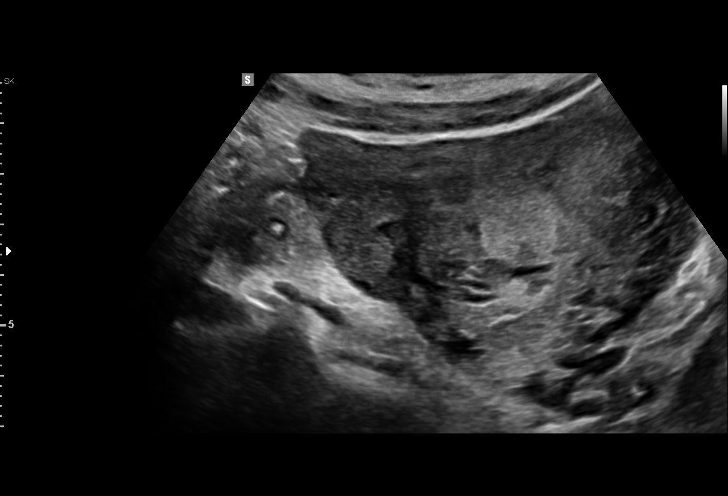
[im 25/33]
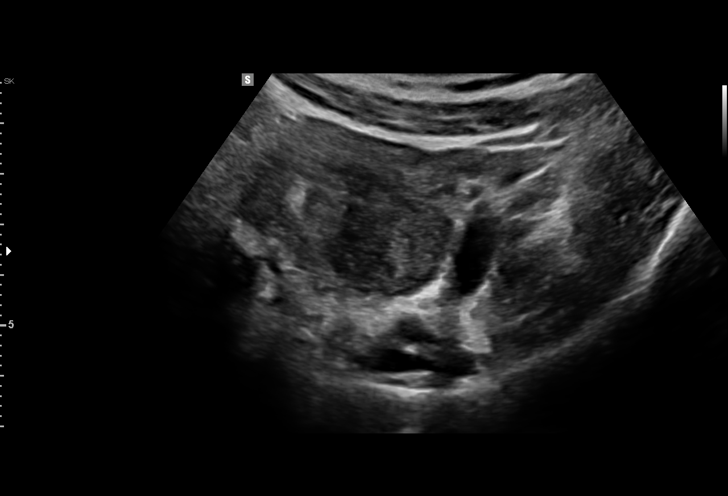
[im 28/33]
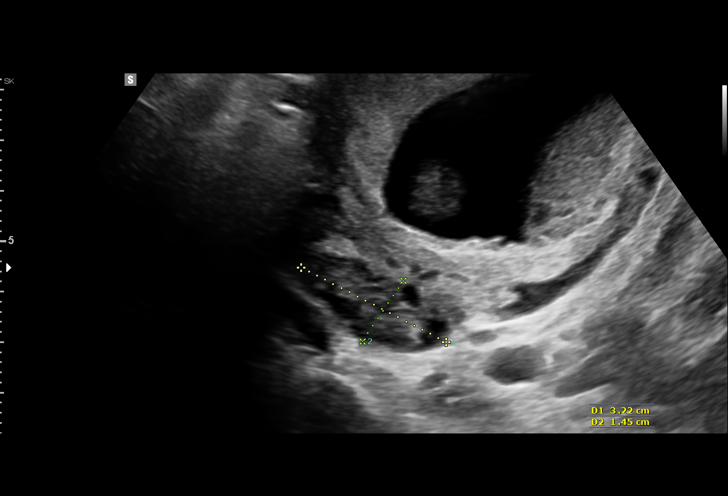
[im 30/33]
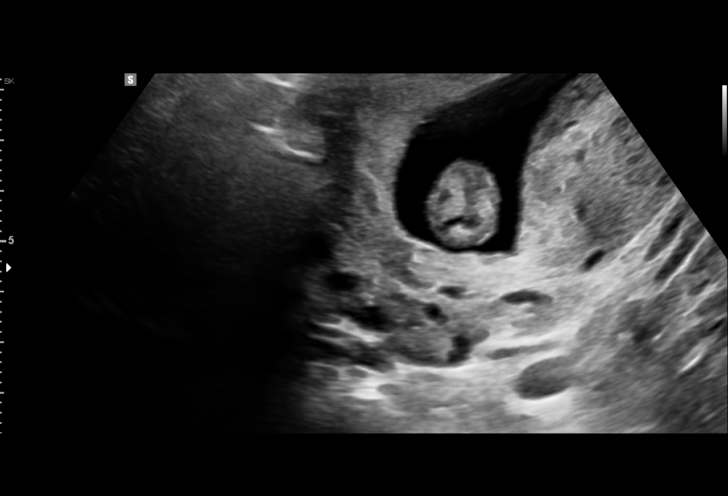
[im 33/33]
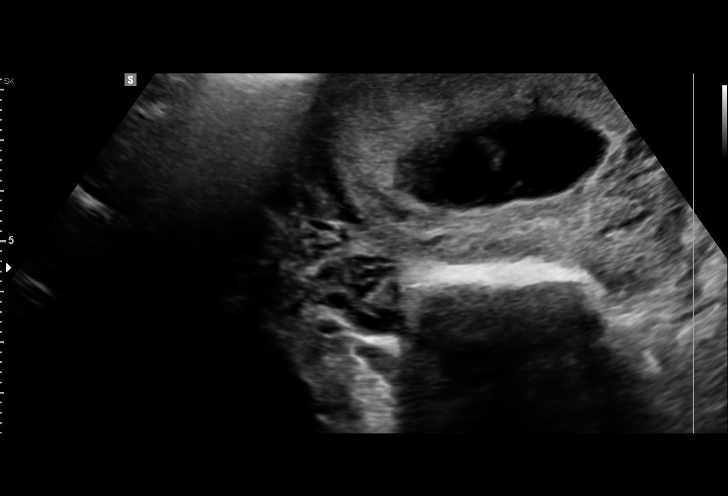

[15 of 28 positions shown; findings below may reference images not displayed]

FINDINGS: Intrauterine gestational sac: A single intrauterine pregnancy is
identified.

Yolk sac:  Yolk sac is not visualized.

Embryo:  Fetal pole is present.

Cardiac Activity: Fetal cardiac activity is observed.

Heart Rate: 165 bpm

CRL:   45.5  mm   11 w 2 d                  US EDC: 03/29/2013

Subchorionic hemorrhage:  None visualized.

Maternal uterus/adnexae: Limited visualization of the uterus without
focal lesion identified. Both ovaries are visualized and demonstrate
normal follicular changes. No abnormal adnexal masses. No free fluid
in the pelvis.
IMPRESSION: Single intrauterine pregnancy. Estimated gestational age by
crown-rump length is 11 weeks 2 days. No specific acute complication
is identified.

## 2018-11-02 ENCOUNTER — Other Ambulatory Visit: Payer: Medicaid Other

## 2018-11-02 ENCOUNTER — Other Ambulatory Visit (HOSPITAL_COMMUNITY)
Admission: RE | Admit: 2018-11-02 | Discharge: 2018-11-02 | Disposition: A | Discharge status: Home / Self Care | Payer: Medicaid Other | Source: Ambulatory Visit | Attending: Obstetrics & Gynecology | Admitting: Obstetrics & Gynecology

## 2018-11-02 ENCOUNTER — Other Ambulatory Visit: Payer: Self-pay

## 2018-11-02 VITALS — BP 109/70 | HR 72

## 2018-11-02 DIAGNOSIS — N898 Other specified noninflammatory disorders of vagina: Secondary | ICD-10-CM | POA: Insufficient documentation

## 2018-11-02 NOTE — Progress Notes (Signed)
LABSUBJECTIVE:  23 y.o. female complains of  vag discharge for a couple of days.  Denies abnormal vaginal bleeding or significant pelvic pain or fever. No UTI symptoms. Denies history of known exposure to STD.  No LMP recorded. (Menstrual status: IUD).  OBJECTIVE:  She appears well, afebrile. Urine dipstick: not done   ASSESSMENT:  Vaginal Discharge small amount  Vaginal Odor small amount    PLAN:  GC, chlamydia, trichomonas, BVAG, CVAG probe sent to lab. Treatment: To be determined once lab results are received ROV prn if symptoms persist or worsen.

## 2018-11-03 ENCOUNTER — Other Ambulatory Visit: Payer: Self-pay | Admitting: *Deleted

## 2018-11-03 DIAGNOSIS — Z20822 Contact with and (suspected) exposure to covid-19: Secondary | ICD-10-CM

## 2018-11-04 LAB — NOVEL CORONAVIRUS, NAA: SARS-CoV-2, NAA: NOT DETECTED

## 2018-11-05 LAB — CERVICOVAGINAL ANCILLARY ONLY
Bacterial vaginitis: POSITIVE — AB
Candida vaginitis: NEGATIVE
Chlamydia: NEGATIVE
Neisseria Gonorrhea: NEGATIVE
Trichomonas: NEGATIVE

## 2018-11-09 ENCOUNTER — Telehealth: Payer: Self-pay | Admitting: *Deleted

## 2018-11-09 MED ORDER — METRONIDAZOLE 0.75 % VA GEL: 1.0000 | Freq: Every day | VAGINAL | 1 refills | Status: DC

## 2018-11-09 NOTE — Telephone Encounter (Signed)
Pt called wanting results, informed of results and pt would like to try gel instead on pills. Will send RX to pharmacy.

## 2019-01-05 ENCOUNTER — Telehealth: Payer: Self-pay | Admitting: *Deleted

## 2019-01-05 MED ORDER — VALACYCLOVIR HCL 1 G PO TABS: 1000.0000 mg | ORAL_TABLET | Freq: Every day | ORAL | 3 refills | Status: DC

## 2019-01-05 NOTE — Telephone Encounter (Signed)
Pt wanting to know if there is any way she can have a  one day dose for her valtrex suppression dose, she is having a hard time remembering to take two ills. . Per Dr Ernestina Patches, we can send in 1000mg  to take daily.

## 2019-01-05 NOTE — Telephone Encounter (Signed)
-----   Message from Allena Earing, NT sent at 01/04/2019  9:09 AM EST ----- Regarding: FW: please call patient  ----- Message ----- From: Allena Earing, NT Sent: 01/04/2019   9:08 AM EST To: [employer] Subject: please call patient                            Patient is requesting a different medication and would like to discuss medicine

## 2019-01-10 ENCOUNTER — Telehealth: Payer: Self-pay | Admitting: *Deleted

## 2019-01-10 NOTE — Telephone Encounter (Signed)
Called pt to let her know that she only needs to take 500mg  valtrex daily for suppression, and refills where called into [facility] for her.

## 2019-01-10 NOTE — Telephone Encounter (Signed)
Called [facility] to switch valtrex strength to 500mg  from 1000mg  #30 x 3 refills, from RX that was sent in on 11/4.

## 2019-02-01 ENCOUNTER — Telehealth: Payer: Self-pay | Admitting: *Deleted

## 2019-02-01 NOTE — Telephone Encounter (Signed)
Pt called stating that the 500mg  valtrex is not helping currently, and pt states she is under a lot of stress and just lost a loved one. Pt asking if she can do the 1000mg  a day until her stress gets better. Okay Per Dr Harolyn Rutherford pt can increase to 1000mg  daily until things get better. Pt verbalizes and understands.

## 2019-02-09 ENCOUNTER — Encounter: Payer: Self-pay | Admitting: Radiology

## 2019-04-22 ENCOUNTER — Encounter (HOSPITAL_COMMUNITY): Payer: Self-pay | Admitting: *Deleted

## 2019-04-22 ENCOUNTER — Emergency Department (HOSPITAL_COMMUNITY)
Admission: EM | Admit: 2019-04-22 | Discharge: 2019-04-23 | Disposition: A | Discharge status: Home / Self Care | Payer: Medicaid Other | Attending: Emergency Medicine | Admitting: Emergency Medicine

## 2019-04-22 ENCOUNTER — Other Ambulatory Visit: Payer: Self-pay

## 2019-04-22 DIAGNOSIS — M545 Low back pain: Secondary | ICD-10-CM | POA: Insufficient documentation

## 2019-04-22 DIAGNOSIS — M79602 Pain in left arm: Secondary | ICD-10-CM | POA: Diagnosis not present

## 2019-04-22 DIAGNOSIS — M542 Cervicalgia: Secondary | ICD-10-CM | POA: Insufficient documentation

## 2019-04-22 DIAGNOSIS — M546 Pain in thoracic spine: Secondary | ICD-10-CM | POA: Diagnosis not present

## 2019-04-22 DIAGNOSIS — R519 Headache, unspecified: Secondary | ICD-10-CM | POA: Diagnosis not present

## 2019-04-22 DIAGNOSIS — M79601 Pain in right arm: Secondary | ICD-10-CM | POA: Diagnosis not present
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
## 2019-04-23 MED ORDER — CYCLOBENZAPRINE HCL 10 MG PO TABS
10.0000 mg | ORAL_TABLET | Freq: Once | ORAL | Status: AC
  Administered 2019-04-23: 10 mg via ORAL
  Filled 2019-04-23: qty 1

## 2019-04-23 MED ORDER — CYCLOBENZAPRINE HCL 10 MG PO TABS: 10.0000 mg | ORAL_TABLET | Freq: Two times a day (BID) | ORAL | 0 refills | Status: DC | PRN

## 2019-04-23 MED ORDER — ACETAMINOPHEN 500 MG PO TABS
1000.0000 mg | ORAL_TABLET | Freq: Once | ORAL | Status: AC
  Administered 2019-04-23: 1000 mg via ORAL
  Filled 2019-04-23: qty 2

## 2019-04-23 NOTE — Discharge Instructions (Addendum)
Take medications as prescribed.  I did not prescribe an anti-inflammatory because Ibuprofen is listed on your allergy list.  Use ice and heat instead.
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
## 2019-05-11 ENCOUNTER — Other Ambulatory Visit (HOSPITAL_COMMUNITY)
Admission: RE | Admit: 2019-05-11 | Discharge: 2019-05-11 | Disposition: A | Discharge status: Home / Self Care | Payer: Medicaid Other | Source: Ambulatory Visit | Attending: Family Medicine | Admitting: Family Medicine

## 2019-05-11 ENCOUNTER — Ambulatory Visit: Payer: Medicaid Other

## 2019-05-11 ENCOUNTER — Other Ambulatory Visit: Payer: Self-pay

## 2019-05-11 VITALS — BP 119/78 | HR 78

## 2019-05-11 DIAGNOSIS — N898 Other specified noninflammatory disorders of vagina: Secondary | ICD-10-CM | POA: Insufficient documentation

## 2019-05-11 NOTE — Progress Notes (Signed)
SUBJECTIVE:  24 y.o. female complains of  Vaginal  discharge  For several days.  Denies abnormal vaginal bleeding or significant pelvic pain or fever. No UTI symptoms. Denies history of known exposure to STD.  No LMP recorded. (Menstrual status: IUD).  OBJECTIVE:  She appears well, afebrile. Urine dipstick: ASSESSMENT:  Vaginal Discharge: small amount  Vaginal Odor: none at this    PLAN:  GC, chlamydia, trichomonas, BVAG, CVAG probe sent to lab. Treatment: To be determined once lab results are received ROV prn if symptoms persist or worsen.

## 2019-05-12 LAB — CERVICOVAGINAL ANCILLARY ONLY
Bacterial Vaginitis (gardnerella): NEGATIVE
Candida Glabrata: NEGATIVE
Candida Vaginitis: NEGATIVE
Chlamydia: NEGATIVE
Comment: NEGATIVE
Comment: NEGATIVE
Comment: NEGATIVE
Comment: NEGATIVE
Comment: NEGATIVE
Comment: NORMAL
Neisseria Gonorrhea: NEGATIVE
Trichomonas: NEGATIVE

## 2019-06-28 ENCOUNTER — Other Ambulatory Visit: Payer: Self-pay | Admitting: *Deleted

## 2019-06-28 MED ORDER — VALACYCLOVIR HCL 500 MG PO TABS: 500.0000 mg | ORAL_TABLET | Freq: Two times a day (BID) | ORAL | 1 refills | Status: DC

## 2019-07-30 ENCOUNTER — Other Ambulatory Visit: Payer: Self-pay | Admitting: Obstetrics & Gynecology

## 2020-05-08 ENCOUNTER — Other Ambulatory Visit: Payer: Self-pay

## 2020-05-08 ENCOUNTER — Other Ambulatory Visit (HOSPITAL_COMMUNITY)
Admission: RE | Admit: 2020-05-08 | Discharge: 2020-05-08 | Disposition: A | Discharge status: Home / Self Care | Payer: Medicaid Other | Source: Ambulatory Visit | Attending: Obstetrics and Gynecology | Admitting: Obstetrics and Gynecology

## 2020-05-08 ENCOUNTER — Ambulatory Visit (INDEPENDENT_AMBULATORY_CARE_PROVIDER_SITE_OTHER): Payer: Medicaid Other

## 2020-05-08 VITALS — BP 119/72 | HR 102

## 2020-05-08 DIAGNOSIS — N898 Other specified noninflammatory disorders of vagina: Secondary | ICD-10-CM

## 2020-05-08 LAB — POCT URINALYSIS DIPSTICK
Blood, UA: NEGATIVE
Leukocytes, UA: NEGATIVE
Protein, UA: NEGATIVE

## 2020-05-08 NOTE — Progress Notes (Signed)
SUBJECTIVE:  25 y.o. female complains of white vaginal discharge for 3 day(s). Denies abnormal vaginal bleeding or significant pelvic pain or fever. No UTI symptoms. Denies history of known exposure to STD.  No LMP recorded. (Menstrual status: IUD).  OBJECTIVE:  She appears well, afebrile. Urine dipstick: negative for all components   ASSESSMENT:  Vaginal Discharge     PLAN:  GC, chlamydia, trichomonas, BVAG, CVAG probe sent to lab. Treatment: To be determined once lab results are received ROV prn if symptoms persist or worsen.

## 2020-05-09 ENCOUNTER — Telehealth: Payer: Self-pay

## 2020-05-09 ENCOUNTER — Other Ambulatory Visit: Payer: Self-pay

## 2020-05-09 LAB — CERVICOVAGINAL ANCILLARY ONLY
Bacterial Vaginitis (gardnerella): POSITIVE — AB
Candida Glabrata: NEGATIVE
Candida Vaginitis: NEGATIVE
Chlamydia: NEGATIVE
Comment: NEGATIVE
Comment: NEGATIVE
Comment: NEGATIVE
Comment: NEGATIVE
Comment: NEGATIVE
Comment: NORMAL
Neisseria Gonorrhea: NEGATIVE
Trichomonas: NEGATIVE

## 2020-05-09 MED ORDER — METRONIDAZOLE 0.75 % VA GEL: 1.0000 | Freq: Every day | VAGINAL | 1 refills | Status: DC

## 2020-05-09 NOTE — Telephone Encounter (Signed)
Pt returning phone call. Verbally given lab results to pt and pt verbalizes understanding.  Pt prefers metrogel, rx sent in for pickup at [facility].  Pt asked what to use for "washing products" advised pt to refrain from using the products the aesthetician had given to her.  Advised pt to use water, and hand, and if HAS to use soap use Dove sensitive skin with no fragrance. Informed pt if these suggestions do not work then different interventions can be discussed with a provider.

## 2020-05-09 NOTE — Telephone Encounter (Signed)
No answer left vm.  

## 2020-05-10 NOTE — Progress Notes (Signed)
Patient was assessed and managed by nursing staff during this encounter. I have reviewed the chart and agree with the documentation and plan. I have also made any necessary editorial changes.  Charlie Pickens, MD 05/10/2020 8:31 AM   

## 2020-07-17 ENCOUNTER — Other Ambulatory Visit: Payer: Self-pay | Admitting: *Deleted

## 2020-07-17 MED ORDER — VALACYCLOVIR HCL 1 G PO TABS: 1000.0000 mg | ORAL_TABLET | Freq: Every day | ORAL | 3 refills | Status: DC

## 2020-08-01 ENCOUNTER — Other Ambulatory Visit: Payer: Self-pay | Admitting: *Deleted

## 2020-08-01 DIAGNOSIS — T8332XA Displacement of intrauterine contraceptive device, initial encounter: Secondary | ICD-10-CM

## 2020-08-07 ENCOUNTER — Ambulatory Visit
Admission: RE | Admit: 2020-08-07 | Discharge: 2020-08-07 | Disposition: A | Discharge status: Home / Self Care | Payer: Medicaid Other | Source: Ambulatory Visit | Attending: Obstetrics and Gynecology | Admitting: Obstetrics and Gynecology

## 2020-08-07 ENCOUNTER — Other Ambulatory Visit: Payer: Self-pay

## 2020-08-07 DIAGNOSIS — T8332XA Displacement of intrauterine contraceptive device, initial encounter: Secondary | ICD-10-CM | POA: Diagnosis present

## 2020-08-13 ENCOUNTER — Other Ambulatory Visit: Payer: Self-pay | Admitting: *Deleted

## 2020-08-13 DIAGNOSIS — T8332XA Displacement of intrauterine contraceptive device, initial encounter: Secondary | ICD-10-CM

## 2020-08-13 NOTE — Progress Notes (Signed)
I don't see a nurse's note as to why this was ordered. If it was ordered due to lost IUD strings at an outside office, please have her get an x ray of her abdomen and pelvis to evaluate for an IUD.

## 2020-08-21 ENCOUNTER — Ambulatory Visit: Payer: Medicaid Other | Admitting: Obstetrics and Gynecology

## 2021-07-29 ENCOUNTER — Other Ambulatory Visit: Payer: Self-pay | Admitting: Family Medicine

## 2021-07-30 ENCOUNTER — Other Ambulatory Visit: Payer: Self-pay | Admitting: *Deleted

## 2021-07-30 NOTE — Telephone Encounter (Signed)
erroneous

## 2022-08-18 ENCOUNTER — Other Ambulatory Visit: Payer: Self-pay | Admitting: Family Medicine

## 2022-08-22 ENCOUNTER — Encounter: Payer: Self-pay | Admitting: Family Medicine

## 2022-08-22 ENCOUNTER — Other Ambulatory Visit (HOSPITAL_COMMUNITY)
Admission: RE | Admit: 2022-08-22 | Discharge: 2022-08-22 | Disposition: A | Discharge status: Home / Self Care | Payer: Self-pay | Source: Ambulatory Visit | Attending: Family Medicine | Admitting: Family Medicine

## 2022-08-22 ENCOUNTER — Ambulatory Visit (INDEPENDENT_AMBULATORY_CARE_PROVIDER_SITE_OTHER): Payer: Self-pay | Admitting: Family Medicine

## 2022-08-22 VITALS — BP 108/68 | HR 74 | Wt 127.0 lb

## 2022-08-22 DIAGNOSIS — B9689 Other specified bacterial agents as the cause of diseases classified elsewhere: Secondary | ICD-10-CM

## 2022-08-22 DIAGNOSIS — Z01419 Encounter for gynecological examination (general) (routine) without abnormal findings: Secondary | ICD-10-CM

## 2022-08-22 DIAGNOSIS — D508 Other iron deficiency anemias: Secondary | ICD-10-CM

## 2022-08-22 DIAGNOSIS — N760 Acute vaginitis: Secondary | ICD-10-CM

## 2022-08-22 DIAGNOSIS — R5382 Chronic fatigue, unspecified: Secondary | ICD-10-CM

## 2022-08-22 DIAGNOSIS — B009 Herpesviral infection, unspecified: Secondary | ICD-10-CM

## 2022-08-22 MED ORDER — VALACYCLOVIR HCL 1 G PO TABS: 1000.0000 mg | ORAL_TABLET | Freq: Every day | ORAL | 4 refills | Status: DC

## 2022-08-22 MED ORDER — FLUCONAZOLE 150 MG PO TABS
150.0000 mg | ORAL_TABLET | Freq: Once | ORAL | 2 refills | Status: AC
Start: 2022-08-22 — End: 2022-08-22

## 2022-08-22 MED ORDER — PREPLUS 27-1 MG PO TABS
1.0000 | ORAL_TABLET | Freq: Every day | ORAL | 13 refills | Status: AC
Start: 2022-08-22 — End: ?

## 2022-08-22 MED ORDER — CLINDAMYCIN HCL 300 MG PO CAPS
300.0000 mg | ORAL_CAPSULE | Freq: Two times a day (BID) | ORAL | 0 refills | Status: DC
Start: 2022-08-22 — End: 2023-03-19

## 2022-08-22 NOTE — Progress Notes (Signed)
GYNECOLOGY ANNUAL PREVENTATIVE CARE ENCOUNTER NOTE  Subjective:   Haley Scott is a 27 y.o. [id number] female here for a routine annual gynecologic exam.  Current complaints: Recurrent BV  Treated mostly through UC -- 7 times in past 6 months. Never on regualr metrogel. Has tried metro, metro gel, clinda cream, clinda oral. Uses no fragrances   Denies abnormal vaginal bleeding, discharge, pelvic pain, problems with intercourse or other gynecologic concerns.    Gynecologic History LMP Patient's last menstrual period was 08/11/2022 (approximate).  Contraception: none Last Pap: 2021. Results were: normal Last mammogram: NA.  Health Maintenance Due  Topic Date Due   COVID-19 Vaccine (1) Never done   DTaP/Tdap/Td (1 - Tdap) Never done   PAP-Cervical Cytology Screening  03/08/2021   PAP SMEAR-Modifier  03/08/2021    The following portions of the patient's history were reviewed and updated as appropriate: allergies, current medications, past family history, past medical history, past social history, past surgical history and problem list.  Review of Systems Pertinent items are noted in HPI.   Objective:  BP 108/68   Pulse 74   Wt 127 lb (57.6 kg)   BMI 24.80 kg/m  CONSTITUTIONAL: Well-developed, well-nourished female in no acute distress.  HENT:  Normocephalic, atraumatic, External right and left ear normal. Oropharynx is clear and moist EYES:  No scleral icterus.  NECK: Normal range of motion, supple, no masses.  Normal thyroid.  SKIN: Skin is warm and dry. No rash noted. Not diaphoretic. No erythema. No pallor. NEUROLOGIC: Alert and oriented to person, place, and time. Normal reflexes, muscle tone coordination. No cranial nerve deficit noted. PSYCHIATRIC: Normal mood and affect. Normal behavior. Normal judgment and thought content. CARDIOVASCULAR: Normal heart rate noted, regular rhythm. 2+ distal pulses. RESPIRATORY: Effort and breath sounds normal, no problems with  respiration noted. BREASTS: Symmetric in size. No masses, skin changes, nipple drainage, or lymphadenopathy. ABDOMEN: Soft,  no distention noted.  No tenderness, rebound or guarding.  PELVIC: Normal appearing external genitalia; normal appearing vaginal mucosa and cervix.  No abnormal discharge noted.  Pap smear obtained.  Normal uterine size, no other palpable masses, no uterine or adnexal tenderness. Chaperone present for exam MUSCULOSKELETAL: Normal range of motion.      Assessment and Plan:  1) Annual gynecologic examination with pap smear:  Will follow up results of pap smear and manage accordingly. STI screen also ordered today.  Routine preventative health maintenance measures emphasized.  2) Contraception counseling: Planning pregnancy - takes MVI  1. HSV-2 (herpes simplex virus 2) infection - valACYclovir (VALTREX) 1000 MG tablet; Take 1 tablet (1,000 mg total) by mouth daily.  Dispense: 90 tablet; Refill: 4  2. Well woman exam with routine gynecological exam - Cervicovaginal ancillary only - Cytology - PAP - Prenatal Vit-Fe Fumarate-FA (PREPLUS) 27-1 MG TABS; Take 1 tablet by mouth daily.  Dispense: 30 tablet; Refill: 13  3. BV (bacterial vaginosis) Discussed BV at length-- has been treated 7 times in past 6 month Reviewed normal bacterial flora Patient notes that sex is a typical trigger regardless of condom use Has been on metronidazole, clinda, and metrogel and clinda cream Has never tried metrogel frequent dosing but is currently uninsured  Discussed a plan to address recurrent BV  - Plan for abx treatment with - clindamycin (CLEOCIN) 300 MG capsule; Take 1 capsule (300 mg total) by mouth 2 (two) times daily. For seven days  Dispense: 14 capsule; Refill: 0 - On her last day of treatment she will take  diflucan - fluconazole (DIFLUCAN) 150 MG tablet; Take 1 tablet (150 mg total) by mouth once for 1 dose. Can take additional dose three days later if symptoms persist   Dispense: 1 tablet; Refill: 2  - After this she will be taking regular and frequent probiotics - Plan for use of boris acid suppositories, Tampon with tea tree oil, apple cider baths/baking soda baths - goal for 8 weeks of no antibiotics to help reestablish normal flora. I think this patient likely has dysbiosis causing difficulty with BV  4. Chronic fatigue - CBC - TSH Rfx on Abnormal to Free T4    Please refer to After Visit Summary for other counseling recommendations.   Return in about 1 year (around 08/22/2023) for Yearly wellness exam.  Federico Flake, MD, MPH, ABFM [facility] for [facility]

## 2022-08-22 NOTE — Progress Notes (Signed)
CC: Recurrent BV Pt requesting to do self swab and needs a refill on her 1000mg  Valtrex that she takes once a day.    Last pap: 2020  Ascus, BV, HPV +

## 2022-08-22 NOTE — Patient Instructions (Signed)
Natural Remedies for Bacterial Vaginosis ° °Option #1 °1 Tbsp Fractitionated Coconut Oil °10 drops of Melaleuca (Tea Tree) Oil ° °Mix ingredients together well.  Soak 3-4 tampons (in applicators) in that mixture until all or mostly all mixture is soaked up into the tampons.  Insert 1 saturated tampon vaginally and wear overnight for 3-4 nights.   ° °Option #2 (sometimes to be used in conjunction with option #1) °Fill tub with enough to cover lap/lower abdomen warm water.  Mix 1/2 cup of baking soda in water.  Soak in water/baking soda mixture for at least 20 minutes.  Be sure to swish water in between legs to get as much in vagina as possible.  This soak should be done after sexual intercourse and menstrual cycles.   ° ° °Option #3 (sometimes to be used in conjunction with option #1 and 2) °Fill tub with enough to cover lap/lower abdomen warm water.  Mix 2-4 cup of apple cider vinegar in water.  Soak in water/vinegar mixture for at least 20 minutes.  Be sure to swish water in between legs to get as much in vagina as possible.  This soak should be done after sexual intercourse and menstrual cycles.   ° °GO WHITE: °Soap: UNSCENTED Dove (white box light green writing) °Laundry detergent (underwear)- Dreft or [employer] unscented °WHITE 100% cotton panties (NOT just cotton crouch) °Sanitary napkin/panty liners: UNSCENTED.  If it doesn't SAY unscented it can have a scent/perfume    °NO PERFUMES OR LOTIONS OR POTIONS in the vulvar area (may use regular [place]) °Condoms: hypoallergenic only. Non dyed (no color) °Toilet papers: white only °Wash clothes: use a separate wash cloth. WHITE.  Wash in Dreft.  ° °You can purchase Tea Tree Oil locally at: ° °[employer] °[address] °[place] [address] ° °[employer] °[address] °[place] [address] ° °Advise that these alternatives will not replace the need to be evaluated if symptoms persist. You will need to seek care at an OB/GYN provider. ° °

## 2022-08-23 LAB — CBC
Hematocrit: 34 % (ref 34.0–46.6)
Hemoglobin: 10 g/dL — ABNORMAL LOW (ref 11.1–15.9)
MCH: 22.1 pg — ABNORMAL LOW (ref 26.6–33.0)
MCHC: 29.4 g/dL — ABNORMAL LOW (ref 31.5–35.7)
MCV: 75 fL — ABNORMAL LOW (ref 79–97)
Platelets: 483 10*3/uL — ABNORMAL HIGH (ref 150–450)
RBC: 4.53 x10E6/uL (ref 3.77–5.28)
RDW: 18.3 % — ABNORMAL HIGH (ref 11.7–15.4)
WBC: 4.3 10*3/uL (ref 3.4–10.8)

## 2022-08-23 LAB — TSH RFX ON ABNORMAL TO FREE T4: TSH: 0.91 u[IU]/mL (ref 0.450–4.500)

## 2022-08-24 LAB — CERVICOVAGINAL ANCILLARY ONLY
Candida Glabrata: NEGATIVE
Candida Vaginitis: POSITIVE — AB
Comment: NEGATIVE
Comment: NEGATIVE
Comment: NEGATIVE

## 2022-08-25 LAB — CYTOLOGY - PAP
Chlamydia: NEGATIVE
Comment: NEGATIVE
Comment: NEGATIVE
Comment: NEGATIVE
Comment: NORMAL
Diagnosis: NEGATIVE
High risk HPV: NEGATIVE
Neisseria Gonorrhea: NEGATIVE
Trichomonas: NEGATIVE

## 2022-08-25 LAB — CERVICOVAGINAL ANCILLARY ONLY
Bacterial Vaginitis (gardnerella): NEGATIVE
Chlamydia: NEGATIVE
Comment: NEGATIVE
Comment: NEGATIVE
Comment: NORMAL
Neisseria Gonorrhea: NEGATIVE
Trichomonas: NEGATIVE

## 2022-08-25 MED ORDER — FERROUS SULFATE 324 (65 FE) MG PO TBEC
1.0000 | DELAYED_RELEASE_TABLET | ORAL | 3 refills | Status: AC
Start: 2022-08-25 — End: ?

## 2022-08-25 NOTE — Addendum Note (Signed)
Addended by: Geanie Berlin on: 08/25/2022 10:10 AM   Modules accepted: Orders

## 2022-09-02 ENCOUNTER — Ambulatory Visit: Payer: Self-pay | Admitting: Obstetrics and Gynecology

## 2023-01-27 ENCOUNTER — Other Ambulatory Visit: Payer: Self-pay | Admitting: Family Medicine

## 2023-02-23 ENCOUNTER — Telehealth: Payer: Self-pay

## 2023-02-23 ENCOUNTER — Other Ambulatory Visit: Payer: Self-pay

## 2023-02-23 DIAGNOSIS — R102 Pelvic and perineal pain unspecified side: Secondary | ICD-10-CM

## 2023-02-23 NOTE — Telephone Encounter (Signed)
Return call to pt to make aware if pain is abdominal she will need to F/U with PCP. If Pelvic and pain is lower we can indeed order a U/S.   No Answer LVM for pt to call back .

## 2023-02-23 NOTE — Telephone Encounter (Signed)
Return call to pt regarding vm on triage line.  Pt had recent urgent care visit for abdominal pain and vaginal discharge. Labs/ STD screening was done also. All negative   Pt certain she has BV, however states she was advised to have imaging studies done pt would like to have U/S first if possible so when she follows up results can be discussed at visit.  Pt concerned. I let pt know I would consult w/ provider this afternoon if she can order U/S.  Pt voiced understanding.

## 2023-03-02 ENCOUNTER — Ambulatory Visit: Payer: Self-pay

## 2023-03-05 ENCOUNTER — Ambulatory Visit: Admission: RE | Admit: 2023-03-05 | Payer: Self-pay | Source: Ambulatory Visit

## 2023-03-06 ENCOUNTER — Ambulatory Visit: Payer: Self-pay

## 2023-03-19 ENCOUNTER — Encounter: Payer: Self-pay | Admitting: Obstetrics & Gynecology

## 2023-03-19 ENCOUNTER — Ambulatory Visit (INDEPENDENT_AMBULATORY_CARE_PROVIDER_SITE_OTHER): Payer: Self-pay | Admitting: Obstetrics & Gynecology

## 2023-03-19 ENCOUNTER — Other Ambulatory Visit (HOSPITAL_COMMUNITY)
Admission: RE | Admit: 2023-03-19 | Discharge: 2023-03-19 | Disposition: A | Discharge status: Home / Self Care | Payer: Self-pay | Source: Ambulatory Visit | Attending: Obstetrics & Gynecology | Admitting: Obstetrics & Gynecology

## 2023-03-19 VITALS — BP 116/74 | HR 76 | Wt 130.0 lb

## 2023-03-19 DIAGNOSIS — R102 Pelvic and perineal pain: Secondary | ICD-10-CM

## 2023-03-19 DIAGNOSIS — B3731 Acute candidiasis of vulva and vagina: Secondary | ICD-10-CM

## 2023-03-19 DIAGNOSIS — B009 Herpesviral infection, unspecified: Secondary | ICD-10-CM

## 2023-03-19 DIAGNOSIS — N760 Acute vaginitis: Secondary | ICD-10-CM | POA: Insufficient documentation

## 2023-03-19 DIAGNOSIS — A493 Mycoplasma infection, unspecified site: Secondary | ICD-10-CM

## 2023-03-19 HISTORY — DX: Mycoplasma infection, unspecified site: A49.3

## 2023-03-19 MED ORDER — CYCLOBENZAPRINE HCL 10 MG PO TABS: 10.0000 mg | ORAL_TABLET | Freq: Three times a day (TID) | ORAL | 2 refills | Status: AC | PRN

## 2023-03-19 MED ORDER — VALACYCLOVIR HCL 1 G PO TABS: 1000.0000 mg | ORAL_TABLET | Freq: Every day | ORAL | 5 refills | Status: AC

## 2023-03-19 MED ORDER — FLUCONAZOLE 150 MG PO TABS: 150.0000 mg | ORAL_TABLET | ORAL | 5 refills | Status: AC

## 2023-03-19 MED ORDER — METRONIDAZOLE 0.75 % VA GEL: 1.0000 | Freq: Every day | VAGINAL | 5 refills | Status: AC

## 2023-03-19 NOTE — Progress Notes (Signed)
GYNECOLOGY OFFICE VISIT NOTE  History:   Haley Scott is a 28 y.o. [phone] here today for evaluation of recurrent BV and pelvic pain.  Has used multiple courses of antibiotics for her BV and boric acid therapy, to no avail.  Also has intermittent upper and lower abdominal pain, worried the lower one can be GYN related.  Says pain happens after lifting at work but can happen intermittently. Has been seen several times in Urgent Care for both complaints.  Also reports periods are getting heavier.  Desires Valtrex refill for HSV infection, getting over an outbreak now.  Also wants refill for Diflucan.  She denies any current abnormal vaginal discharge, bleeding, pelvic pain or other concerns.    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   Low grade squamous intraepith lesion on cytologic smear cervix (lgsil) 06/19/2016   Trichomonal vaginitis during pregnancy 04/03/2016   Umbilical hernia 2001   UTI in pregnancy, antepartum 11/12/2015   @18  wk- E coli UTI--> treated with Keflex [x]  TOC neg +mid nov [x]  toc late December-->january: neg    Past Surgical History:  Procedure Laterality Date   HERNIA REPAIR      The following portions of the patient's history were reviewed and updated as appropriate: allergies, current medications, past family history, past medical history, past social history, past surgical history and problem list.   Health Maintenance:  Normal pap and negative HRHPV on 08/22/2022.     Review of Systems:  Pertinent items noted in HPI and remainder of comprehensive ROS otherwise negative.  Physical Exam:  BP 116/74   Pulse 76   Wt 130 lb (59 kg)   BMI 25.39 kg/m  CONSTITUTIONAL: Well-developed, well-nourished female in no acute distress.  HEENT:  Normocephalic, atraumatic. External right and left ear normal. No scleral icterus.  NECK: Normal range of motion, supple, no  masses noted on observation SKIN: No rash noted. Not diaphoretic. No erythema. No pallor. MUSCULOSKELETAL: Normal range of motion. No edema noted. NEUROLOGIC: Alert and oriented to person, place, and time. Normal muscle tone coordination. No cranial nerve deficit noted. PSYCHIATRIC: Normal mood and affect. Normal behavior. Normal judgment and thought content. CARDIOVASCULAR: Normal heart rate noted RESPIRATORY: Effort and breath sounds normal, no problems with respiration noted ABDOMEN: No masses noted. No other overt distention noted.   No tenderness on examination. PELVIC: Healing HSV ulcers noted on inferior aspect of right labium majus.  Malodorous grey-white, thin discharge noted, testing sample obtained.  Normal urethral meatus. Performed in the presence of a chaperone    Assessment and Plan:     1. HSV-2 (herpes simplex virus 2) infection Valtrex refilled - valACYclovir (VALTREX) 1000 MG tablet; Take 1 tablet (1,000 mg total) by mouth daily. Take for 5 days for any outbreak episode  Dispense: 30 tablet; Refill: 5  2. Pelvic pain Unsure etiology, but given connection to work it is likely musculoskeletal. Will evaluate for GYN etiologies though, labs and ultrasound ordered.  will follow up results and manage accordingly.  Flexeril ordered prn, also will take Tylenol as needed (allergic to NSAIDs). - Cervicovaginal ancillary only - Genital Mycoplasmas NAA, Swab - cyclobenzaprine (FLEXERIL) 10 MG tablet; Take 1 tablet (10 mg total) by mouth 3 (three) times daily as needed for muscle spasms.  Dispense: 30 tablet; Refill: 2  3. Yeast vaginitis Refills given - fluconazole (DIFLUCAN) 150 MG tablet; Take 1 tablet (150 mg total)  by mouth every 3 (three) days. For three doses  Dispense: 3 tablet; Refill: 5  4. Recurrent vaginitis (Primary) Patient washes area with specialized vaginal wash and Dial antibacterial soap. Advised to switch to [facility] sensitive skin and water, "less is more".  Proper  vulvar hygiene emphasized: discussed avoidance of perfumed soaps, detergents, lotions and any type of douches; in addition to wearing cotton underwear and no underwear at night.  Also recommended cleaning front to back, voiding and cleaning up after intercourse.   Will presumptively treat with prolonged metronidazole course for recurrent BV.  If Mycoplasma infection is found, will treat accordingly. - Cervicovaginal ancillary only - Genital Mycoplasmas NAA, Swab - metroNIDAZOLE (METROGEL) 0.75 % vaginal gel; Place 1 Applicatorful vaginally at bedtime. Apply one applicatorful to vagina at bedtime for 10 days, then twice a week for 6 months.  Dispense: 70 g; Refill: 5   Routine preventative health maintenance measures emphasized. Please refer to After Visit Summary for other counseling recommendations.   Return for any gynecologic concerns.    I spent 30 minutes dedicated to the care of this patient including pre-visit review of records, face to face time with the patient discussing her conditions and treatments and post visit orders.    Jaynie Collins, MD, FACOG Obstetrician & Gynecologist, [facility] for [employer], [facility]

## 2023-03-21 LAB — GENITAL MYCOPLASMAS NAA, SWAB
Mycoplasma genitalium NAA: NEGATIVE
Mycoplasma hominis NAA: POSITIVE — AB
Ureaplasma spp NAA: NEGATIVE

## 2023-03-23 ENCOUNTER — Telehealth: Payer: Self-pay | Admitting: *Deleted

## 2023-03-23 ENCOUNTER — Encounter: Payer: Self-pay | Admitting: Obstetrics & Gynecology

## 2023-03-23 LAB — CERVICOVAGINAL ANCILLARY ONLY
Bacterial Vaginitis (gardnerella): POSITIVE — AB
Candida Glabrata: NEGATIVE
Candida Vaginitis: NEGATIVE
Chlamydia: NEGATIVE
Comment: NEGATIVE
Comment: NEGATIVE
Comment: NEGATIVE
Comment: NEGATIVE
Comment: NEGATIVE
Comment: NORMAL
Neisseria Gonorrhea: NEGATIVE
Trichomonas: NEGATIVE

## 2023-03-23 MED ORDER — DOXYCYCLINE HYCLATE 100 MG PO CAPS: 100.0000 mg | ORAL_CAPSULE | Freq: Two times a day (BID) | ORAL | 0 refills | Status: AC

## 2023-03-23 NOTE — Addendum Note (Signed)
Addended by: Jaynie Collins A on: 03/23/2023 08:07 AM   Modules accepted: Orders

## 2023-03-23 NOTE — Telephone Encounter (Signed)
Called pt, informed of results and recommendations from Dr Macon Large.   Also pt asked if still needs to get [place] does since this new result is back. Since pt is self pay, advised to reschedule [place] to later date and see if the medication starts to relieve any of her symptoms and then to let me know so I cam make the provider aware. Also recommended that she may could find an outpatient imaging center closer to her and that the cost would be less then an US done in the hospital. Pt will let us know if she finds one and then we can transfer the order to them.   Scheryl Marten, RN

## 2023-03-23 NOTE — Telephone Encounter (Signed)
-----   Message from [place] Anyanwu sent at 03/23/2023  8:07 AM EST ----- Patient has Mycoplasma genital infection, this can be associated with recurrent bacterial vaginitis,  However, Mycoplasma is a sexually transmitted pathogen. It can cause symptoms of pelvic pain, abnormal bleeding or vaginal discharge but can also be asymptomatic.  Doxycycline is prescribed for treatment, patient should let her sexual partner(s) know so they can be treated too.  Please call to inform patient of results and recommendations.  Jaynie Collins, MD

## 2023-03-24 NOTE — Progress Notes (Signed)
Recurrent bacterial vaginitis already being treated with prolonged vaginal metronidazole therapy.

## 2023-03-26 ENCOUNTER — Ambulatory Visit: Payer: Self-pay | Attending: Obstetrics & Gynecology

## 2023-04-12 IMAGING — US US PELVIS COMPLETE WITH TRANSVAGINAL
1 series · 13 of 25 positions shown · non-contrast
Comparison: None

CLINICAL DATA: Initial evaluation for lost IUD strings.



[Series 1: us pelvic complete with transvaginal · 13 of 82 slices shown]
[im 1/82]
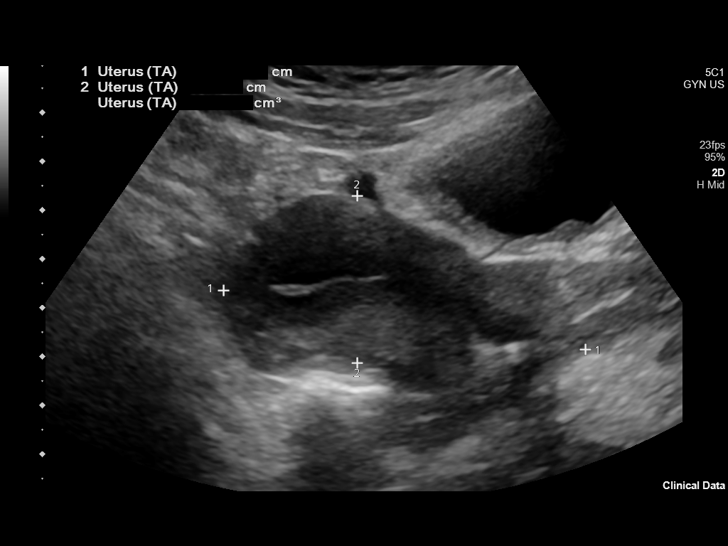
[im 7/82]
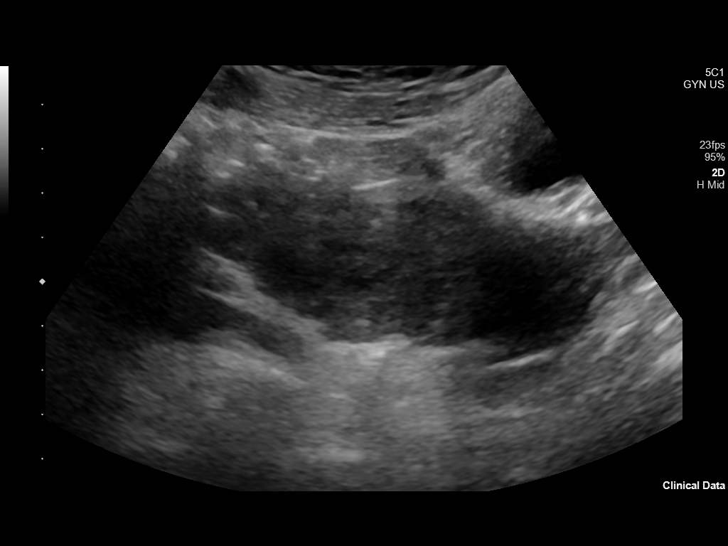
[im 14/82]
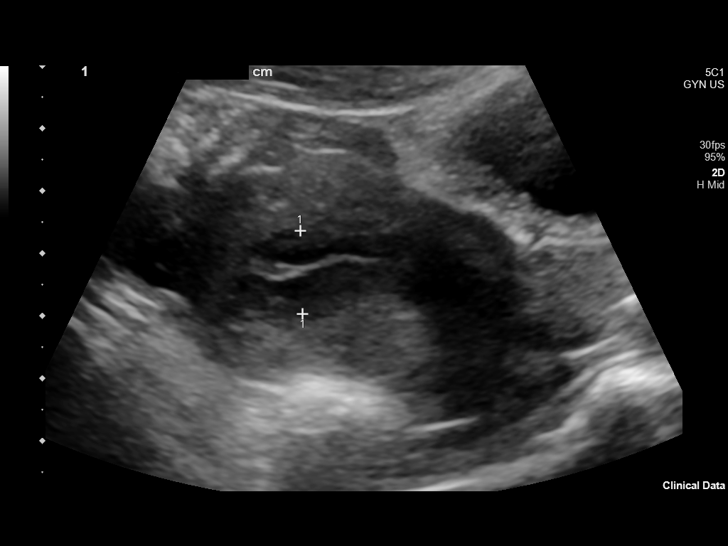
[im 21/82]
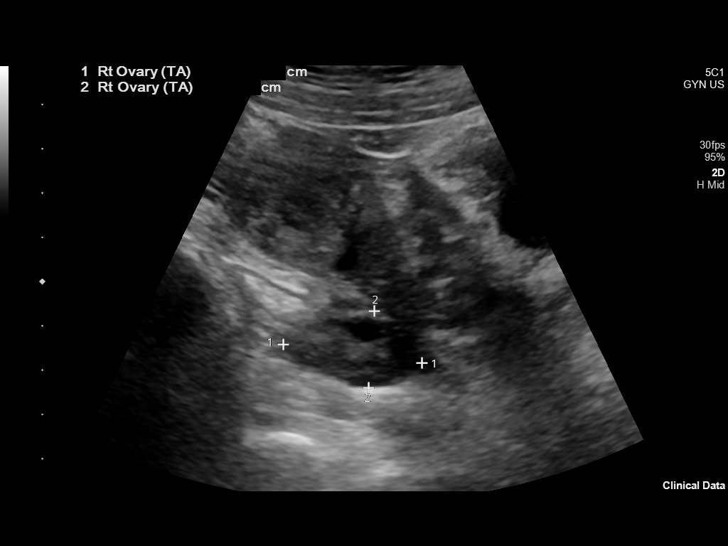
[im 28/82]
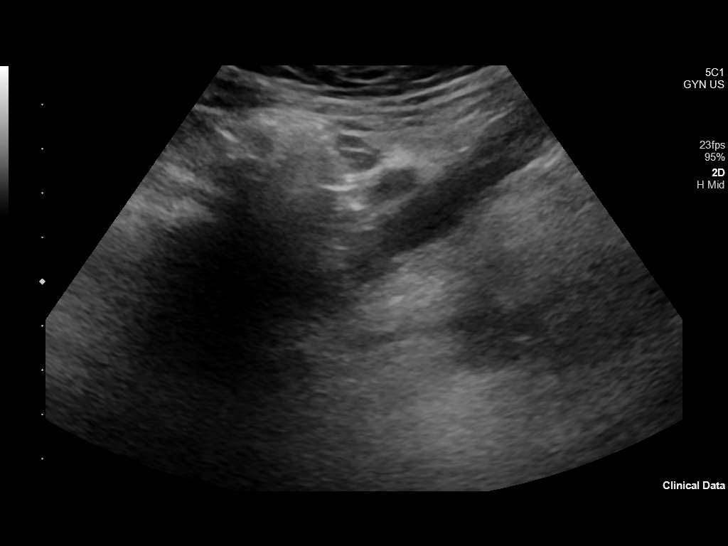
[im 34/82]
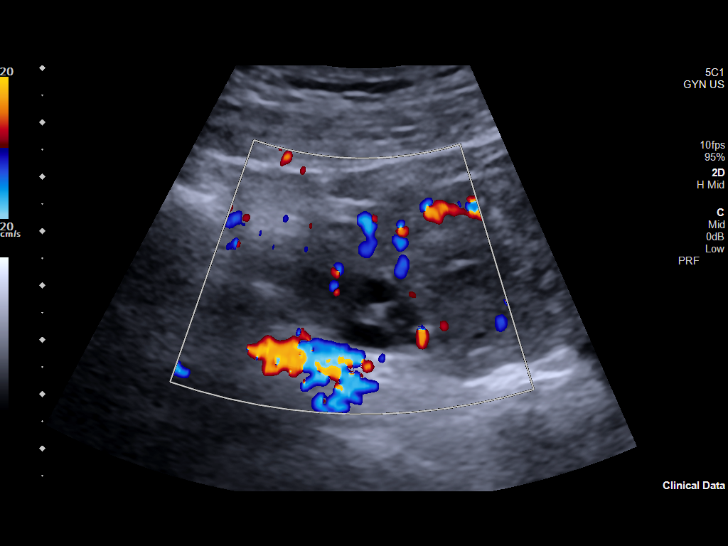
[im 41/82]
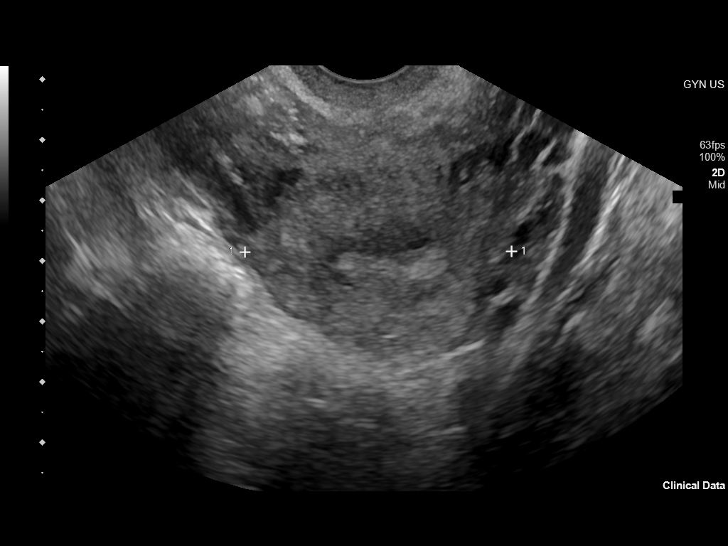
[im 48/82]
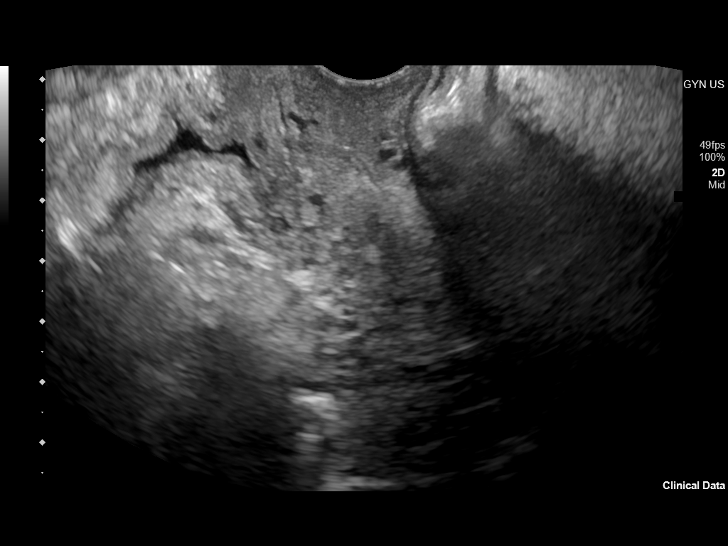
[im 55/82]
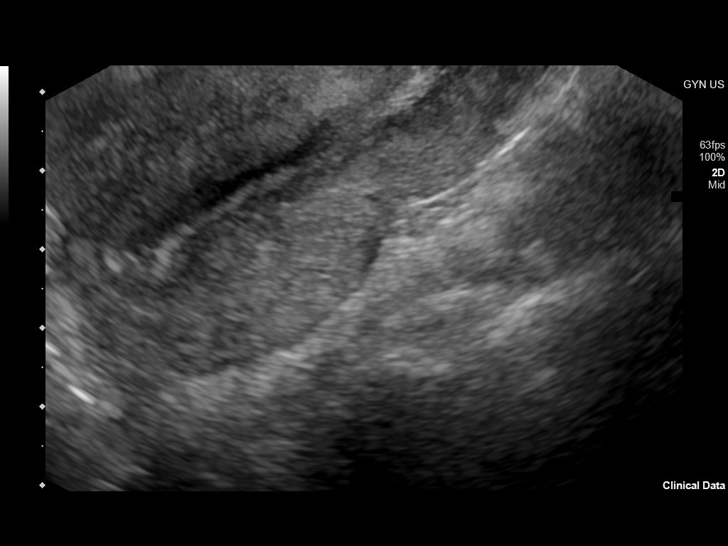
[im 61/82]
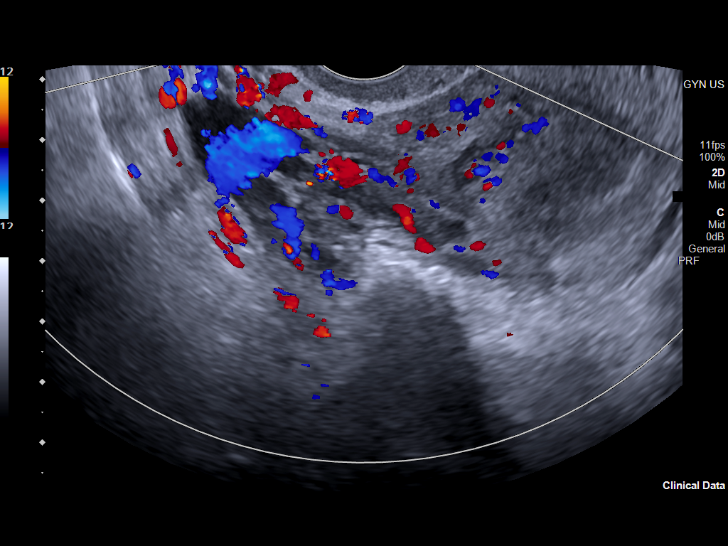
[im 68/82]
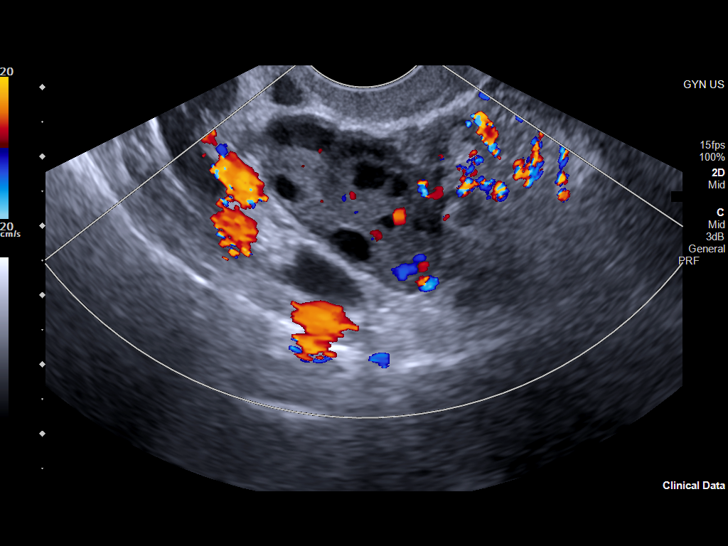
[im 75/82]
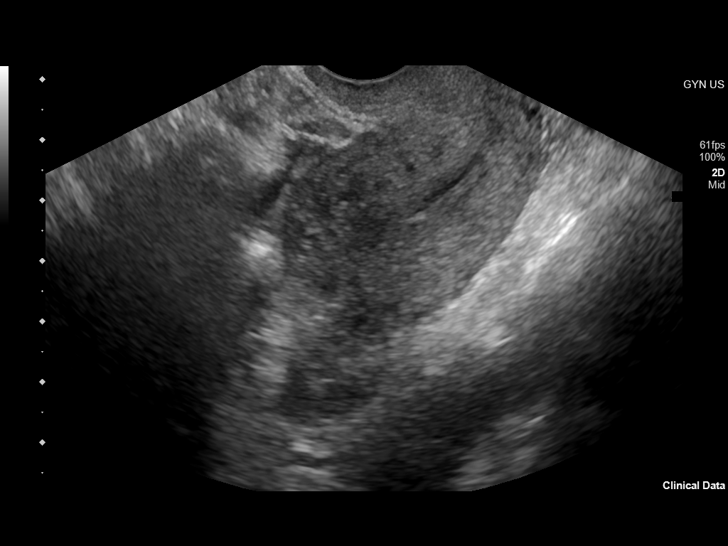
[im 82/82]
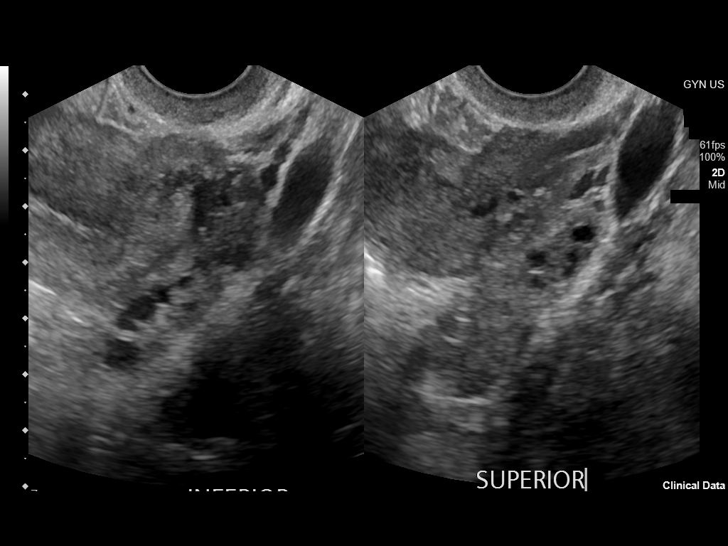

[13 of 25 positions shown; findings below may reference images not displayed]

FINDINGS: Uterus

Measurements: 6.9 x 3.9 x 4.4 cm = volume: 61.6 mL. Uterus is
anteverted. No discrete fibroid or other mass.

Endometrium

Thickness: 5.5 mm. No focal abnormality visualized. IUD not
visualized within the endometrial cavity.

Right ovary

Measurements: 4.7 x 1.5 x 3.1 cm = volume: 12.0 mL. Normal
appearance/no adnexal mass.

Left ovary

Measurements: 1.7 x 2.0 x 4.0 cm = volume: 7.0 mL. Normal
appearance/no adnexal mass.

Other findings

Trace free physiologic fluid present within the pelvis.
IMPRESSION: 1. Non visualization of the IUD. Correlation with plain film
radiography of the pelvis suggested to evaluate for potential
malpositioning.
2. Otherwise unremarkable and normal pelvic ultrasound.

## 2024-01-01 ENCOUNTER — Ambulatory Visit: Payer: Self-pay | Admitting: Family Medicine

## 2024-01-01 ENCOUNTER — Telehealth: Payer: Self-pay | Admitting: Family Medicine

## 2024-01-01 NOTE — Telephone Encounter (Signed)
 Pt called to cancel her appt due to aunts funeral and wanted to be r/s'd.  Per [place] she will call and r/s pt.

## 2024-01-21 ENCOUNTER — Ambulatory Visit: Payer: Self-pay | Admitting: Family Medicine
